# Patient Record
Sex: Female | Born: 1950 | Race: White | Hispanic: No | Smoking: Former smoker
Health system: Southern US, Community
[De-identification: ages and names within clinical notes are randomized; demographics above are authoritative.]

## PROBLEM LIST (undated history)

## (undated) DIAGNOSIS — T7840XA Allergy, unspecified, initial encounter: Secondary | ICD-10-CM

## (undated) DIAGNOSIS — I726 Aneurysm of vertebral artery: Secondary | ICD-10-CM

## (undated) DIAGNOSIS — I10 Essential (primary) hypertension: Secondary | ICD-10-CM

## (undated) DIAGNOSIS — K219 Gastro-esophageal reflux disease without esophagitis: Secondary | ICD-10-CM

## (undated) DIAGNOSIS — R4701 Aphasia: Secondary | ICD-10-CM

## (undated) DIAGNOSIS — I739 Peripheral vascular disease, unspecified: Secondary | ICD-10-CM

## (undated) DIAGNOSIS — I639 Cerebral infarction, unspecified: Secondary | ICD-10-CM

## (undated) DIAGNOSIS — F419 Anxiety disorder, unspecified: Secondary | ICD-10-CM

## (undated) DIAGNOSIS — E785 Hyperlipidemia, unspecified: Secondary | ICD-10-CM

## (undated) DIAGNOSIS — F32A Depression, unspecified: Secondary | ICD-10-CM

## (undated) DIAGNOSIS — F329 Major depressive disorder, single episode, unspecified: Secondary | ICD-10-CM

## (undated) HISTORY — DX: Anxiety disorder, unspecified: F41.9

## (undated) HISTORY — DX: Hyperlipidemia, unspecified: E78.5

## (undated) HISTORY — DX: Depression, unspecified: F32.A

## (undated) HISTORY — DX: Major depressive disorder, single episode, unspecified: F32.9

## (undated) HISTORY — DX: Allergy, unspecified, initial encounter: T78.40XA

---

## 2014-07-16 ENCOUNTER — Ambulatory Visit (INDEPENDENT_AMBULATORY_CARE_PROVIDER_SITE_OTHER): Payer: 59 | Admitting: Family Medicine

## 2014-07-16 VITALS — BP 185/90 | HR 86 | Temp 97.4°F | Resp 16 | Ht 60.25 in | Wt 159.6 lb

## 2014-07-16 DIAGNOSIS — F17200 Nicotine dependence, unspecified, uncomplicated: Secondary | ICD-10-CM

## 2014-07-16 DIAGNOSIS — I10 Essential (primary) hypertension: Secondary | ICD-10-CM

## 2014-07-16 MED ORDER — ATENOLOL 50 MG PO TABS: 50.0000 mg | ORAL_TABLET | Freq: Every day | ORAL | Status: DC

## 2014-07-16 NOTE — Progress Notes (Signed)
Chief Complaint:  Chief Complaint  Patient presents with  . Medication Refill    Atenolol 50 mg  . Headache    pt indicates she has headache because of no blood pressure meds    HPI: Diana Wells is a 63 y.o. female who is here for  HTN med refills She has had dx of HTN 6 months ago, She was lost to follow-up due to heathinsurance coverage She is just having HAs no CP or SOB, palpitations, n/v/abd pain, n/w/t [facility] and they did blood work and got blood work,BP was  177/96 but never gave her meds She is also a smoker used to smoke 1.5 ppd x since age 66, she is down to 1 ppd , has tried wellburin, chantix ( made her ahlluciante), she is tryign to taper down on her own   Past Medical History  Diagnosis Date  . Anxiety    History reviewed. No pertinent past surgical history. History   Social History  . Marital Status: Divorced    Spouse Name: N/A    Number of Children: N/A  . Years of Education: N/A   Social History Main Topics  . Smoking status: Light Tobacco Smoker  . Smokeless tobacco: None  . Alcohol Use: No  . Drug Use: No  . Sexual Activity: None   Other Topics Concern  . None   Social History Narrative  . None   History reviewed. No pertinent family history. No Known Allergies Prior to Admission medications   Medication Sig Start Date End Date Taking? Authorizing Provider  atenolol (TENORMIN) 50 MG tablet Take 50 mg by mouth daily.   Yes Historical Provider, MD     ROS: The patient denies fevers, chills, night sweats, unintentional weight loss, chest pain, palpitations, wheezing, dyspnea on exertion, nausea, vomiting, abdominal pain, dysuria, hematuria, melena, numbness, weakness, or tingling.   All other systems have been reviewed and were otherwise negative with the exception of those mentioned in the HPI and as above.    PHYSICAL EXAM: Filed Vitals:   07/16/14 1243  BP: 185/90  Pulse: 86  Temp: 97.4 F (36.3 C)  Resp:  16   Filed Vitals:   07/16/14 1243  Height: 5' 0.25" (1.53 m)  Weight: 159 lb 9.6 oz (72.394 kg)   Body mass index is 30.93 kg/(m^2).  General: Alert, no acute distress HEENT:  Normocephalic, atraumatic, oropharynx patent. EOMI, PERRLA, fundo exam normal Cardiovascular:  Regular rate and rhythm, no rubs murmurs or gallops.  No Carotid bruits, radial pulse intact. No pedal edema.  Respiratory: Clear to auscultation bilaterally.  No wheezes, rales, or rhonchi.  No cyanosis, no use of accessory musculature GI: No organomegaly, abdomen is soft and non-tender, positive bowel sounds.  No masses. Skin: No rashes. Neurologic: Facial musculature symmetric. CN 2-12 grossly normal Psychiatric: Patient is appropriate throughout our interaction. Lymphatic: No cervical lymphadenopathy Musculoskeletal: Gait intact.   LABS: No results found for this or any previous visit.   EKG/XRAY:   Primary read interpreted by Dr. Conley Rolls at [facility]   ASSESSMENT/PLAN: Encounter Diagnoses  Name Primary?  . Essential hypertension Yes  . Tobacco dependence    F/u in 1 month , decline any other tobacco cessation options Refilled atenolol Need BP recheck, [facility] currnetly asymptomatic BP goals advised at < 150/90 Go to ER prn for cP, SOB, worse HA of her life, n/v/abd pain   Gross sideeffects, risk and benefits,  and alternatives of medications d/w patient. Patient is aware that all medications have potential sideeffects and we are unable to predict every sideeffect or drug-drug interaction that may occur.  Coden Franchi PHUONG, DO 07/16/2014 1:32 PM

## 2014-07-16 NOTE — Patient Instructions (Signed)
Nicotine Addiction Nicotine can act as both a stimulant (excites/activates) and a sedative (calms/quiets). Immediately after exposure to nicotine, there is a "kick" caused in part by the drug's stimulation of the adrenal glands and resulting discharge of adrenaline (epinephrine). The rush of adrenaline stimulates the body and causes a sudden release of sugar. This means that smokers are always slightly hyperglycemic. Hyperglycemic means that the blood sugar is high, just like in diabetics. Nicotine also decreases the amount of insulin which helps control sugar levels in the body. There is an increase in blood pressure, breathing, and the rate of heart beats.  In addition, nicotine indirectly causes a release of dopamine in the brain that controls pleasure and motivation. A similar reaction is seen with other drugs of abuse, such as cocaine and heroin. This dopamine release is thought to cause the pleasurable sensations when smoking. In some different cases, nicotine can also create a calming effect, depending on sensitivity of the smoker's nervous system and the dose of nicotine taken. WHAT HAPPENS WHEN NICOTINE IS TAKEN FOR LONG PERIODS OF TIME?  Long-term use of nicotine results in addiction. It is difficult to stop.  Repeated use of nicotine creates tolerance. Higher doses of nicotine are needed to get the "kick." When nicotine use is stopped, withdrawal may last a month or more. Withdrawal may begin within a few hours after the last cigarette. Symptoms peak within the first few days and may lessen within a few weeks. For some people, however, symptoms may last for months or longer. Withdrawal symptoms include:   Irritability.  Craving.  Learning and attention deficits.  Sleep disturbances.  Increased appetite. Craving for tobacco may last for 6 months or longer. Many behaviors done while using nicotine can also play a part in the severity of withdrawal symptoms. For some people, the feel,  smell, and sight of a cigarette and the ritual of obtaining, handling, lighting, and smoking the cigarette are closely linked with the pleasure of smoking. When stopped, they also miss the related behaviors which make the withdrawal or craving worse. While nicotine gum and patches may lessen the drug aspects of withdrawal, cravings often persist. WHAT ARE THE MEDICAL CONSEQUENCES OF NICOTINE USE?  Nicotine addiction accounts for one-third of all cancers. The top cancer caused by tobacco is lung cancer. Lung cancer is the number one cancer killer of both men and women.  Smoking is also associated with cancers of the:  Mouth.  Pharynx.  Larynx.  Esophagus.  Stomach.  Pancreas.  Cervix.  Kidney.  Ureter.  Bladder.  Smoking also causes lung diseases such as lasting (chronic) bronchitis and emphysema.  It worsens asthma in adults and children.  Smoking increases the risk of heart disease, including:  Stroke.  Heart attack.  Vascular disease.  Aneurysm.  Passive or secondary smoke can also increase medical risks including:  Asthma in children.  Sudden Infant Death Syndrome (SIDS).  Additionally, dropped cigarettes are the leading cause of residential fire fatalities.  Nicotine poisoning has been reported from accidental ingestion of tobacco products by children and pets. Death usually results in a few minutes from respiratory failure (when a person stops breathing) caused by paralysis. TREATMENT   Medication. Nicotine replacement medicines such as nicotine gum and the patch are used to stop smoking. These medicines gradually lower the dosage of nicotine in the body. These medicines do not contain the carbon monoxide and other toxins found in tobacco smoke.  Hypnotherapy.  Relaxation therapy.  Nicotine Anonymous (a 12-step support   program). Find times and locations in your local yellow pages. Document Released: 08/05/2004 Document Revised: 02/22/2012 Document  Reviewed: 01/26/2014 ExitCare Patient Information 2015 [employer]. This information is not intended to replace advice given to you by your health care provider. Make sure you discuss any questions you have with your health care provider. Hypertension Hypertension, commonly called high blood pressure, is when the force of blood pumping through your arteries is too strong. Your arteries are the blood vessels that carry blood from your heart throughout your body. A blood pressure reading consists of a higher number over a lower number, such as 110/72. The higher number (systolic) is the pressure inside your arteries when your heart pumps. The lower number (diastolic) is the pressure inside your arteries when your heart relaxes. Ideally you want your blood pressure below 120/80. Hypertension forces your heart to work harder to pump blood. Your arteries may become narrow or stiff. Having hypertension puts you at risk for heart disease, stroke, and other problems.  RISK FACTORS Some risk factors for high blood pressure are controllable. Others are not.  Risk factors you cannot control include:   Race. You may be at higher risk if you are African American.  Age. Risk increases with age.  Gender. Men are at higher risk than women before age 45 years. After age 65, women are at higher risk than men. Risk factors you can control include:  Not getting enough exercise or physical activity.  Being overweight.  Getting too much fat, sugar, calories, or salt in your diet.  Drinking too much alcohol. SIGNS AND SYMPTOMS Hypertension does not usually cause signs or symptoms. Extremely high blood pressure (hypertensive crisis) may cause headache, anxiety, shortness of breath, and nosebleed. DIAGNOSIS  To check if you have hypertension, your health care provider will measure your blood pressure while you are seated, with your arm held at the level of your heart. It should be measured at least twice using  the same arm. Certain conditions can cause a difference in blood pressure between your right and left arms. A blood pressure reading that is higher than normal on one occasion does not mean that you need treatment. If one blood pressure reading is high, ask your health care provider about having it checked again. TREATMENT  Treating high blood pressure includes making lifestyle changes and possibly taking medicine. Living a healthy lifestyle can help lower high blood pressure. You may need to change some of your habits. Lifestyle changes may include:  Following the DASH diet. This diet is high in fruits, vegetables, and whole grains. It is low in salt, red meat, and added sugars.  Getting at least 2 hours of brisk physical activity every week.  Losing weight if necessary.  Not smoking.  Limiting alcoholic beverages.  Learning ways to reduce stress. If lifestyle changes are not enough to get your blood pressure under control, your health care provider may prescribe medicine. You may need to take more than one. Work closely with your health care provider to understand the risks and benefits. HOME CARE INSTRUCTIONS  Have your blood pressure rechecked as directed by your health care provider.   Take medicines only as directed by your health care provider. Follow the directions carefully. Blood pressure medicines must be taken as prescribed. The medicine does not work as well when you skip doses. Skipping doses also puts you at risk for problems.   Do not smoke.   Monitor your blood pressure at home as directed by   your health care provider. SEEK MEDICAL CARE IF:   You think you are having a reaction to medicines taken.  You have recurrent headaches or feel dizzy.  You have swelling in your ankles.  You have trouble with your vision. SEEK IMMEDIATE MEDICAL CARE IF:  You develop a severe headache or confusion.  You have unusual weakness, numbness, or feel faint.  You have  severe chest or abdominal pain.  You vomit repeatedly.  You have trouble breathing. MAKE SURE YOU:   Understand these instructions.  Will watch your condition.  Will get help right away if you are not doing well or get worse. Document Released: 11/30/2005 Document Revised: 04/16/2014 Document Reviewed: 09/22/2013 ExitCare Patient Information 2015 [employer]. This information is not intended to replace advice given to you by your health care provider. Make sure you discuss any questions you have with your health care provider.  

## 2014-07-17 ENCOUNTER — Inpatient Hospital Stay (HOSPITAL_COMMUNITY)
Admission: EM | Admit: 2014-07-17 | Discharge: 2014-07-19 | DRG: 066 | Disposition: A | Discharge status: Home / Self Care | Payer: 59 | Attending: Family Medicine | Admitting: Family Medicine

## 2014-07-17 ENCOUNTER — Inpatient Hospital Stay (HOSPITAL_COMMUNITY): Payer: 59

## 2014-07-17 ENCOUNTER — Emergency Department (HOSPITAL_COMMUNITY): Payer: 59

## 2014-07-17 ENCOUNTER — Encounter (HOSPITAL_COMMUNITY): Payer: Self-pay | Admitting: Emergency Medicine

## 2014-07-17 DIAGNOSIS — I728 Aneurysm of other specified arteries: Secondary | ICD-10-CM | POA: Diagnosis present

## 2014-07-17 DIAGNOSIS — F801 Expressive language disorder: Secondary | ICD-10-CM | POA: Diagnosis present

## 2014-07-17 DIAGNOSIS — Z79899 Other long term (current) drug therapy: Secondary | ICD-10-CM

## 2014-07-17 DIAGNOSIS — E785 Hyperlipidemia, unspecified: Secondary | ICD-10-CM

## 2014-07-17 DIAGNOSIS — Z880 Allergy status to penicillin: Secondary | ICD-10-CM

## 2014-07-17 DIAGNOSIS — I708 Atherosclerosis of other arteries: Secondary | ICD-10-CM | POA: Diagnosis present

## 2014-07-17 DIAGNOSIS — I6529 Occlusion and stenosis of unspecified carotid artery: Secondary | ICD-10-CM | POA: Diagnosis present

## 2014-07-17 DIAGNOSIS — I671 Cerebral aneurysm, nonruptured: Secondary | ICD-10-CM

## 2014-07-17 DIAGNOSIS — I10 Essential (primary) hypertension: Secondary | ICD-10-CM | POA: Diagnosis present

## 2014-07-17 DIAGNOSIS — E7800 Pure hypercholesterolemia, unspecified: Secondary | ICD-10-CM | POA: Diagnosis present

## 2014-07-17 DIAGNOSIS — J438 Other emphysema: Secondary | ICD-10-CM | POA: Diagnosis present

## 2014-07-17 DIAGNOSIS — Z66 Do not resuscitate: Secondary | ICD-10-CM | POA: Diagnosis present

## 2014-07-17 DIAGNOSIS — I6522 Occlusion and stenosis of left carotid artery: Secondary | ICD-10-CM

## 2014-07-17 DIAGNOSIS — I6350 Cerebral infarction due to unspecified occlusion or stenosis of unspecified cerebral artery: Secondary | ICD-10-CM | POA: Diagnosis not present

## 2014-07-17 DIAGNOSIS — I639 Cerebral infarction, unspecified: Secondary | ICD-10-CM | POA: Diagnosis present

## 2014-07-17 DIAGNOSIS — F17200 Nicotine dependence, unspecified, uncomplicated: Secondary | ICD-10-CM | POA: Diagnosis present

## 2014-07-17 HISTORY — DX: Essential (primary) hypertension: I10

## 2014-07-17 LAB — CBC
HCT: 43 % (ref 36.0–46.0)
Hemoglobin: 14.2 g/dL (ref 12.0–15.0)
MCH: 29.3 pg (ref 26.0–34.0)
MCHC: 33 g/dL (ref 30.0–36.0)
MCV: 88.8 fL (ref 78.0–100.0)
Platelets: 249 10*3/uL (ref 150–400)
RBC: 4.84 MIL/uL (ref 3.87–5.11)
RDW: 13.3 % (ref 11.5–15.5)
WBC: 11.3 10*3/uL — ABNORMAL HIGH (ref 4.0–10.5)

## 2014-07-17 LAB — COMPREHENSIVE METABOLIC PANEL
ALBUMIN: 4.4 g/dL (ref 3.5–5.2)
ALT: 15 U/L (ref 0–35)
AST: 16 U/L (ref 0–37)
Alkaline Phosphatase: 92 U/L (ref 39–117)
Anion gap: 15 (ref 5–15)
BILIRUBIN TOTAL: 0.5 mg/dL (ref 0.3–1.2)
BUN: 9 mg/dL (ref 6–23)
CO2: 23 mEq/L (ref 19–32)
CREATININE: 0.69 mg/dL (ref 0.50–1.10)
Calcium: 9.4 mg/dL (ref 8.4–10.5)
Chloride: 99 mEq/L (ref 96–112)
GFR calc Af Amer: >90 mL/min (ref >90)
GFR calc non Af Amer: >90 mL/min (ref >90)
Glucose, Bld: 121 mg/dL — ABNORMAL HIGH (ref 70–99)
Potassium: 3.6 mEq/L — ABNORMAL LOW (ref 3.7–5.3)
Sodium: 137 mEq/L (ref 137–147)
Total Protein: 7.3 g/dL (ref 6.0–8.3)

## 2014-07-17 LAB — COMPLETE METABOLIC PANEL WITH GFR
AST: 14 U/L (ref 0–37)
Albumin: 4.7 g/dL (ref 3.5–5.2)
Alkaline Phosphatase: 80 U/L (ref 39–117)
BUN: 8 mg/dL (ref 6–23)
CO2: 26 mEq/L (ref 19–32)
Creat: 0.7 mg/dL (ref 0.50–1.10)
GFR, Est African American: >89 mL/min
GFR, Est Non African American: >89 mL/min
Glucose, Bld: 124 mg/dL — ABNORMAL HIGH (ref 70–99)
Total Bilirubin: 0.5 mg/dL (ref 0.2–1.2)
Total Protein: 7 g/dL (ref 6.0–8.3)

## 2014-07-17 LAB — DIFFERENTIAL
BASOS PCT: 0 % (ref 0–1)
Basophils Absolute: 0 10*3/uL (ref 0.0–0.1)
Eosinophils Absolute: 0.1 10*3/uL (ref 0.0–0.7)
Eosinophils Relative: 0 % (ref 0–5)
Lymphocytes Relative: 18 % (ref 12–46)
Lymphs Abs: 2.1 10*3/uL (ref 0.7–4.0)
MONO ABS: 0.9 10*3/uL (ref 0.1–1.0)
Monocytes Relative: 8 % (ref 3–12)
NEUTROS ABS: 8.3 10*3/uL — AB (ref 1.7–7.7)
Neutrophils Relative %: 74 % (ref 43–77)

## 2014-07-17 LAB — COMPLETE METABOLIC PANEL WITHOUT GFR
ALT: 15 U/L (ref 0–35)
Calcium: 9.3 mg/dL (ref 8.4–10.5)
Chloride: 104 meq/L (ref 96–112)
Potassium: 4 meq/L (ref 3.5–5.3)
Sodium: 141 meq/L (ref 135–145)

## 2014-07-17 LAB — I-STAT TROPONIN, ED: TROPONIN I, POC: 0 ng/mL (ref 0.00–0.08)

## 2014-07-17 LAB — APTT: APTT: 28 s (ref 24–37)

## 2014-07-17 LAB — PROTIME-INR
INR: 1.04 (ref 0.00–1.49)
Prothrombin Time: 13.6 seconds (ref 11.6–15.2)

## 2014-07-17 MED ORDER — STROKE: EARLY STAGES OF RECOVERY BOOK
Freq: Once | Status: DC
  Filled 2014-07-17 (×2): qty 1

## 2014-07-17 MED ORDER — HEPARIN SODIUM (PORCINE) 5000 UNIT/ML IJ SOLN
5000.0000 [IU] | Freq: Three times a day (TID) | INTRAMUSCULAR | Status: DC
Start: 2014-07-17 — End: 2014-07-19
  Administered 2014-07-18 – 2014-07-19 (×5): 5000 [IU] via SUBCUTANEOUS
  Filled 2014-07-17 (×5): qty 1

## 2014-07-17 MED ORDER — SENNOSIDES-DOCUSATE SODIUM 8.6-50 MG PO TABS: 1.0000 | ORAL_TABLET | Freq: Every evening | ORAL | Status: DC | PRN

## 2014-07-17 MED ORDER — ASPIRIN EC 81 MG PO TBEC
81.0000 mg | DELAYED_RELEASE_TABLET | Freq: Every day | ORAL | Status: DC
  Administered 2014-07-18 – 2014-07-19 (×3): 81 mg via ORAL
  Filled 2014-07-17 (×4): qty 1

## 2014-07-17 NOTE — H&P (Signed)
[facility] and Physical Service Pager: [phone]  Patient name: Diana Wells Medical record number: [id number] Date of birth: 02/07/1951 Age: 63 y.o. Gender: female  Primary Care Provider: No PCP Per Patient Consultants: Neurology Code Status: DNR- after discussion with patinet   Chief Complaint: Aphasia   Assessment and Plan: Diana Wells is a 63 y.o. 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 PMH is significant for hypertension, anxiety and tobacco abuse.  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 Unsure of the exact etiology in this patient, as she seems fairly healthy per her daughter. Head is atraumatic with no history of falls. Given her onset of symptoms, she is not a candidate for tPA. EKG in NSR on admission. Will assess for carotid atherosclerosis, arrhthymias, and heart as sources for possible emoblus formation.  - Admit to neuro telemetry under FMTS, attending Dr. McDiarmid  - Neurology following, appreciate recs - MRI/MRA of the brain without contrast pending  - F.u Carotid dopplers  - F/u echocardiogram - Given unusual ST sloping on initial EKG, will repeat EKG in AM  - F/u neuro checks - F/u risk stratification labs: TSH, A1c, lipids - F/u CXR  - ASA 81mg   - Consider statin, pending fasting lipid panel in am - Bedside swallow evaluation - C/s speech, PT, and OT in the AM  Lung crackles on exam: Given patient's extensive smoking history and crackles noted on exam, there is a concern for some sort of pulmonary malignancy that has metastasized to the brain which could be contributing to her neurologic finding. No SOB. Will continue to monitor O2 saturation -CXR pending - May consider spirometry as an outpt to evaluate for COPD/emphysema   Hypertension: Patient on atenolol as an outpatient, just restarted on 8/3. - Will hold  anti-hypertensives currently  - likely do not need permissive HTN at this time, however will hold meds for the night given increased symptoms this am (L cheek numbness) which have improved today - Continue to monitor and consider re-starting in the AM  FEN/GI: NPO until she passes a swallow study> heart healthy, NS IV lock Prophylaxis: SQ heparin   Disposition: Admit to telemetry.   History of Present Illness: Diana Wells is a 63 y.o. female presenting with confusion and difficulty putting words together this this morning.  Of note, this history was taken from the patient's daughter, Kathreen CosierRhonda Tuggle.  The patient's daughter saw her this AM and noted that she seemed to be confused and would answer questions with 1-2 word phases.  The patient endorsed numbness of the left side of her face that began this AM.  The patient's daughter did not note any facial droop, drooling, or weakness. She denies any falls.  Of note, the patient had an unsteady gait last week (Wednesday or Thursday) which has since improved. At that time, she was noted to be walking sideways but no history of falls per the daughter.    Per the patient: She started noticing some confusion on Tuesday with difficulty finding words- it has been very frustrating. She does not recall any falls but wonders if maybe she hit her head at sometime.  Noted she did have a frontal headache yesterday when she went to see her PCP. Denies any numbness, tingling, weakness, change in vision, dizziness or headache currently. No chest pain or SOB.  Of note, the patient was  seen by [place] on 07/16/14 requesting medication refill of her atenolol and and endorsing a headache due to her hypertension. At that time her BP was 185/90. Per PE, there was not gross neurologic deficits noted at that time.   Review Of Systems:  Otherwise 12 point review of systems was performed and was unremarkable.  There are no active problems to display for this patient.  Past  Medical History: Past Medical History  Diagnosis Date  . Anxiety   Hypertension   Past Surgical History: History reviewed. No pertinent past surgical history. Social History: History  Substance Use Topics  . Smoking status: Light Tobacco Smoker  . Smokeless tobacco: Not on file  . Alcohol Use: No   Additional social history: Per PCP noted on 8/3: pt used to smoke 1.5ppd since the age of 68 and she is currently down to 1ppd. She smokes in secret, her daughter found cigarettes one day but was unsure how much she truly smoked.  Please also refer to relevant sections of EMR.  Family History: History reviewed. No pertinent family history. Allergies and Medications: Allergies  Allergen Reactions  . Codeine Other (See Comments)    Makes patient incoherent and involuntarily perform acts that she does not remember.   . Erythromycin Itching and Rash    Hallucinations  . Penicillins Itching and Rash    Hallucinations    No current facility-administered medications on file prior to encounter.   No current outpatient prescriptions on file prior to encounter.    Objective: BP 183/76  Pulse 82  Temp(Src) 98 F (36.7 C) (Oral)  Resp 18  Ht 5' (1.524 m)  Wt 150 lb (68.04 kg)  BMI 29.30 kg/m2  SpO2 98% Exam: General: 63 y/o female lying in bed in NAD HEENT: Atraumatic, Normocephalic. PERRLA, EOMI, Oropharynx clear. MMM.  Cardiovascular: RRR, no m/r/g noted.  Respiratory: Crackles in the lungs bilaterally in the base>apex. No wheezing or rhonchi noted, non labored  Abdomen: +BS. Soft ND/NT.  Extremities: No gross deformities noted. No edema Skin: No rashes Neuro: Alert. Oriented to person, situation, and place ([address]> ). Month was April but self corrected, difficulties finding the word August. Follows directions appropriately. Comprehension intact. Spontaneous language non-fluent with difficulties with word finding. Facial movements symmetric to smile and eyebrow  rise. Shoulder shrug and neck rotation intact and equal. Facial sensation intact and equal. Sensation of the LE and UE intact and equal. Grip strength nml and equal. UE and LE strength 5/5 bilaterally. Did not assess gait.  Labs and Imaging: CBC BMET   Recent Labs Lab 07/17/14 1902  WBC 11.3*  HGB 14.2  HCT 43.0  PLT 249    Recent Labs Lab 07/17/14 1902  NA 137  K 3.6*  CL 99  CO2 23  BUN 9  CREATININE 0.69  GLUCOSE 121*  CALCIUM 9.4    ISTAT Troponin 0.00   EKG 8/4: NSR. HR 82. Odd, sloping appearance of ST waves in II, III, and aVF with a return to baseline and upright T waves. T wave inversions in aVR and V1. QTc 434. No previous EKGs for comparisons  CT head 8/4: Hypodensities within the left basal ganglia and within the left occipital lobe which can be seen with age-indeterminate/likely subacute infarcts. There is mild expansion of the left basal ganglia with associated mass effect. As these represent different vascular distributions (MCA and PCA) recommend further evaluation with MRI/MRA.    Joanna Puff, MD 07/17/2014, 9:06 PM PGY-1, [facility] Family [facility] FPTS Intern pager: [phone], text pages welcome  I have seen and examined the patient with Dr. Leonides Schanz an I agree with her documentation above, my annotations are in blue.   Murtis Sink, MD [facility] Family Medicine Resident, PGY-3 07/18/2014, 12:10 AM

## 2014-07-17 NOTE — ED Notes (Signed)
Pt reports that last tuesday that she started having trouble being able to put words together. States that it seems to have remained about the same. States that she was seen at her PCP and told to come here for further work up.

## 2014-07-17 NOTE — Progress Notes (Signed)
Called E.D for report .  [facility]

## 2014-07-17 NOTE — ED Notes (Signed)
Transporting Patient to new room assignment. 

## 2014-07-17 NOTE — ED Notes (Signed)
When pt was asked why she came to the ED today she reports "I don't know."  When asked if she hurt she said no.  When asked if she had a medical condition or another problem that prompted her to come to the ED, pt stated "no."  Pt felt she did not need to be here and is upset with her daughter for bringing her here.  Upon further speaking with pt, found to be aphasic.  Unable to get the correct words out.  Told me today's date was [birth year], her age is [age].  Reports she saw her PMD yesterday, daughter stated she was given a prescription for atenolol.  Pt s/o told daughter that pt had been "walking sideways" that began approx one weeks ago and he had noticed other changes in her as well.

## 2014-07-17 NOTE — ED Provider Notes (Signed)
CSN: [id number]     Arrival date & time 07/17/14  1843 History   First MD Initiated Contact with Patient 07/17/14 1946     Chief Complaint  Patient presents with  . Aphasia     (Consider location/radiation/quality/duration/timing/severity/associated sxs/prior Treatment) HPI Comments: Pt comes in with cc of confusion. States that since last Thursday, she has been having trouble putting words together. Pt also states that may be she was a lttle unsteady to walk lat week, which has improved now. Pt has no numbness, tingling, weakness, no other complains Pt has HTN hx, no other medical problems.  The history is provided by the patient.    Past Medical History  Diagnosis Date  . Anxiety    History reviewed. No pertinent past surgical history. History reviewed. No pertinent family history. History  Substance Use Topics  . Smoking status: Light Tobacco Smoker  . Smokeless tobacco: Not on file  . Alcohol Use: No   OB History   Grav Para Term Preterm Abortions TAB SAB Ect Mult Living                 Review of Systems  Constitutional: Negative for activity change.  HENT: Negative for facial swelling.   Respiratory: Negative for cough, shortness of breath and wheezing.   Cardiovascular: Negative for chest pain.  Gastrointestinal: Negative for nausea, vomiting, abdominal pain, diarrhea, constipation, blood in stool and abdominal distention.  Genitourinary: Negative for hematuria and difficulty urinating.  Musculoskeletal: Negative for neck pain.  Skin: Negative for color change.  Neurological: Negative for dizziness, syncope, speech difficulty, weakness, light-headedness, numbness and headaches.  Hematological: Does not bruise/bleed easily.  Psychiatric/Behavioral: Negative for confusion.      Allergies  Codeine; Erythromycin; and Penicillins  Home Medications   Prior to Admission medications   Medication Sig Start Date End Date Taking? Authorizing Provider  atenolol  (TENORMIN) 50 MG tablet Take 1 tablet (50 mg total) by mouth daily. 07/16/14   Thao P Le, DO   BP 183/76  Pulse 87  Temp(Src) 97.6 F (36.4 C) (Oral)  Resp 18  Ht 5' (1.524 m)  Wt 150 lb (68.04 kg)  BMI 29.30 kg/m2  SpO2 99% Physical Exam  Nursing note and vitals reviewed. Constitutional: She is oriented to person, place, and time. She appears well-developed and well-nourished.  HENT:  Head: Normocephalic and atraumatic.  Eyes: EOM are normal. Pupils are equal, round, and reactive to light.  Neck: Neck supple.  Cardiovascular: Normal rate, regular rhythm and normal heart sounds.   No murmur heard. Pulmonary/Chest: Effort normal. No respiratory distress.  Abdominal: Soft. She exhibits no distension. There is no tenderness. There is no rebound and no guarding.  Neurological: She is alert and oriented to person, place, and time. No cranial nerve deficit. Coordination normal.  Pt has aphasia, otherwise.  Sensory exam normal for bilateral upper and lower extremities - and patient is able to discriminate between sharp and dull. Motor exam is 4+/5 for bilateral extremities.   Skin: Skin is warm and dry.    ED Course  Procedures (including critical care time) Labs Review Labs Reviewed  CBC - Abnormal; Notable for the following:    WBC 11.3 (*)    All other components within normal limits  DIFFERENTIAL - Abnormal; Notable for the following:    Neutro Abs 8.3 (*)    All other components within normal limits  PROTIME-INR  APTT  COMPREHENSIVE METABOLIC PANEL  I-STAT TROPOININ, ED    Imaging Review  Ct Head (brain) Wo Contrast  07/17/2014   CLINICAL DATA:  Speech difficulty  EXAM: CT HEAD WITHOUT CONTRAST  TECHNIQUE: Contiguous axial images were obtained from the base of the skull through the vertex without intravenous contrast.  COMPARISON:  None.  FINDINGS: Hypodensity within the left caudate and putamin which is mildly enlarged with mild mass effect on the adjacent lateral  ventricle. Additionally there is suggestion of peripheral hypodensity within the left occipital lobe. No evidence for mass effect, intracranial mass lesion or acute hemorrhage. Paranasal sinuses are unremarkable. Mastoid air cells are well aerated. Calvarium is intact.  IMPRESSION: Hypodensities within the left basal ganglia and within the left occipital lobe which can be seen with age-indeterminate/likely subacute infarcts. There is mild expansion of the left basal ganglia with associated mass effect. As these represent different vascular distributions (MCA and PCA) recommend further evaluation with MRI/MRA.  Critical Value/emergent results were called by telephone at the time of interpretation on 07/17/2014 at 7:47 pm to Dr. Langston Masker, who verbally acknowledged these results.   Electronically Signed   By: Annia Belt M.D.   On: 07/17/2014 19:52     EKG Interpretation   Date/Time:  Tuesday July 17 2014 18:59:52 EDT Ventricular Rate:  82 PR Interval:  124 QRS Duration: 94 QT Interval:  372 QTC Calculation: 434 R Axis:   77 Text Interpretation:  Normal sinus rhythm ST \\T \ T wave abnormality,  consider inferior ischemia Abnormal ECG No STEMI Confirmed by Jamilia Jacques,  MD, Liesel Peckenpaugh [phone]) on 07/17/2014 8:16:49 PM      MDM   Final diagnoses:  None    DDx includes:  Stroke - ischemic vs. hemorrhagic Aneurysms  Pt with hx of htn comes in with difficulty putting words together. She is noted to be aphasic, and her CT scan shows subacute stroke. Pt's stroke area is in 2 different vascular supply - so Rads is recommending further studies. We will get Neurology on board, and get studies as they recommend.  Not a TPA candidate given the onset was several days back.   Derwood Kaplan, MD 07/17/14 2023

## 2014-07-17 NOTE — Consult Note (Addendum)
Referring Physician: ED    Chief Complaint: language impairment,confusion  HPI:                                                                                                                                         Diana Wells is an 63 y.o. female, right handed, with a past medical history significant for HTN, smoking, and anxiety, brought in by family for further evaluation of the above stated symptoms. She lives home with family and she tells me that she was doing well until this past Thursday when she started noticing difficulty expressing herself, " I know what I want to say but can not get it completely out". In addition, she said that she has been a little bit confused and her daughter said that she doesn't recall her name. Denies associated HA, vertigo, double vision, difficulty swallowing, focal weakness or numbness, unsteadiness, or slurred speech. No CP, SOB, or palpitations. Daughter denied noticing face asymmetry.   CT brain today revealed hypodensities within the left basal ganglia and within the left occipital lobe. Date last known well: 7/31 Time last known well: uncertain tPA Given: no, late presentation   Past Medical History  Diagnosis Date  . Anxiety     History reviewed. No pertinent past surgical history.  History reviewed. No pertinent family history. Social History:  reports that she has been smoking.  She does not have any smokeless tobacco history on file. She reports that she does not drink alcohol or use illicit drugs.  Allergies:  Allergies  Allergen Reactions  . Codeine Other (See Comments)    Makes patient incoherent and involuntarily perform acts that she does not remember.   . Erythromycin Itching and Rash    Hallucinations  . Penicillins Itching and Rash    Hallucinations     Medications:                                                                                                                           I have reviewed the patient's current  medications.  ROS:  History obtained from the patient, family, and chart review.  General ROS: negative for - chills, fatigue, fever, night sweats, weight gain or weight loss Psychological ROS: negative for - behavioral disorder, hallucinations, memory difficulties, mood swings or suicidal ideation Ophthalmic ROS: negative for - blurry vision, double vision, eye pain or loss of vision ENT ROS: negative for - epistaxis, nasal discharge, oral lesions, sore throat, tinnitus or vertigo Allergy and Immunology ROS: negative for - hives or itchy/watery eyes Hematological and Lymphatic ROS: negative for - bleeding problems, bruising or swollen lymph nodes Endocrine ROS: negative for - galactorrhea, hair pattern changes, polydipsia/polyuria or temperature intolerance Respiratory ROS: negative for - cough, hemoptysis, shortness of breath or wheezing Cardiovascular ROS: negative for - chest pain, dyspnea on exertion, edema or irregular heartbeat Gastrointestinal ROS: negative for - abdominal pain, diarrhea, hematemesis, nausea/vomiting or stool incontinence Genito-Urinary ROS: negative for - dysuria, hematuria, incontinence or urinary frequency/urgency Musculoskeletal ROS: negative for - joint swelling or muscular weakness Neurological ROS: as noted in HPI Dermatological ROS: negative for rash and skin lesion changes  Physical exam: pleasant female in no apparent distress. Blood pressure 172/77, pulse 70, temperature 98 F (36.7 C), temperature source Oral, resp. rate 18, height 5' (1.524 m), weight 68.04 kg (150 lb), SpO2 96.00%. Head: normocephalic. Neck: supple, no bruits, no JVD. Cardiac: no murmurs. Lungs: clear. Abdomen: soft, no tender, no mass. Extremities: no edema. Neurologic Examination:                                                                                                       General: Mental Status: Alert, oriented, thought content appropriate. Comprehension intact but spontaneous language is non fluent. Follows comples commands without difficulty. Cranial Nerves: II: Discs flat bilaterally; Visual fields grossly normal, pupils equal, round, reactive to light and accommodation III,IV, VI: ptosis not present, extra-ocular motions intact bilaterally V,VII: smile symmetric, facial light touch sensation normal bilaterally VIII: hearing normal bilaterally IX,X: gag reflex present XI: bilateral shoulder shrug XII: midline tongue extension without atrophy or fasciculations Motor: Right : Upper extremity   5/5    Left:     Upper extremity   5/5  Lower extremity   5/5     Lower extremity   5/5 Tone and bulk:normal tone throughout; no atrophy noted Sensory: Pinprick and light touch intact throughout, bilaterally Deep Tendon Reflexes:  Right: Upper Extremity   Left: Upper extremity   biceps (C-5 to C-6) 2/4   biceps (C-5 to C-6) 2/4 tricep (C7) 2/4    triceps (C7) 2/4 Brachioradialis (C6) 2/4  Brachioradialis (C6) 2/4  Lower Extremity Lower Extremity  quadriceps (L-2 to L-4) 2/4   quadriceps (L-2 to L-4) 2/4 Achilles (S1) 2/4   Achilles (S1) 2/4  Plantars: Right: downgoing   Left: downgoing Cerebellar: normal finger-to-nose,  normal heel-to-shin test Gait:  No tested    Results for orders placed during the hospital encounter of 07/17/14 (from the past 48 hour(s))  PROTIME-INR     Status: None   Collection Time    07/17/14  7:02 PM  Result Value Ref Range   Prothrombin Time 13.6  11.6 - 15.2 seconds   INR 1.04  0.00 - 1.49  APTT     Status: None   Collection Time    07/17/14  7:02 PM      Result Value Ref Range   aPTT 28  24 - 37 seconds  CBC     Status: Abnormal   Collection Time    07/17/14  7:02 PM      Result Value Ref Range   WBC 11.3 (*) 4.0 - 10.5 K/uL   RBC 4.84  3.87 - 5.11 MIL/uL   Hemoglobin 14.2  12.0 -  15.0 g/dL   HCT 23.4  14.4 - 36.0 %   MCV 88.8  78.0 - 100.0 fL   MCH 29.3  26.0 - 34.0 pg   MCHC 33.0  30.0 - 36.0 g/dL   RDW 16.5  80.0 - 63.4 %   Platelets 249  150 - 400 K/uL  DIFFERENTIAL     Status: Abnormal   Collection Time    07/17/14  7:02 PM      Result Value Ref Range   Neutrophils Relative % 74  43 - 77 %   Neutro Abs 8.3 (*) 1.7 - 7.7 K/uL   Lymphocytes Relative 18  12 - 46 %   Lymphs Abs 2.1  0.7 - 4.0 K/uL   Monocytes Relative 8  3 - 12 %   Monocytes Absolute 0.9  0.1 - 1.0 K/uL   Eosinophils Relative 0  0 - 5 %   Eosinophils Absolute 0.1  0.0 - 0.7 K/uL   Basophils Relative 0  0 - 1 %   Basophils Absolute 0.0  0.0 - 0.1 K/uL  COMPREHENSIVE METABOLIC PANEL     Status: Abnormal   Collection Time    07/17/14  7:02 PM      Result Value Ref Range   Sodium 137  137 - 147 mEq/L   Potassium 3.6 (*) 3.7 - 5.3 mEq/L   Chloride 99  96 - 112 mEq/L   CO2 23  19 - 32 mEq/L   Glucose, Bld 121 (*) 70 - 99 mg/dL   BUN 9  6 - 23 mg/dL   Creatinine, Ser 9.49  0.50 - 1.10 mg/dL   Calcium 9.4  8.4 - 44.7 mg/dL   Total Protein 7.3  6.0 - 8.3 g/dL   Albumin 4.4  3.5 - 5.2 g/dL   AST 16  0 - 37 U/L   ALT 15  0 - 35 U/L   Alkaline Phosphatase 92  39 - 117 U/L   Total Bilirubin 0.5  0.3 - 1.2 mg/dL   GFR calc non Af Amer >90  >90 mL/min   GFR calc Af Amer >90  >90 mL/min   Comment: (NOTE)     The eGFR has been calculated using the CKD EPI equation.     This calculation has not been validated in all clinical situations.     eGFR's persistently <90 mL/min signify possible Chronic Kidney     Disease.   Anion gap 15  5 - 15  I-STAT TROPOININ, ED     Status: None   Collection Time    07/17/14  8:00 PM      Result Value Ref Range   Troponin i, poc 0.00  0.00 - 0.08 ng/mL   Comment 3            Comment: Due to the release kinetics  of cTnI,     a negative result within the first hours     of the onset of symptoms does not rule out     myocardial infarction with certainty.      If myocardial infarction is still suspected,     repeat the test at appropriate intervals.   Ct Head (brain) Wo Contrast  07/17/2014   CLINICAL DATA:  Speech difficulty  EXAM: CT HEAD WITHOUT CONTRAST  TECHNIQUE: Contiguous axial images were obtained from the base of the skull through the vertex without intravenous contrast.  COMPARISON:  None.  FINDINGS: Hypodensity within the left caudate and putamin which is mildly enlarged with mild mass effect on the adjacent lateral ventricle. Additionally there is suggestion of peripheral hypodensity within the left occipital lobe. No evidence for mass effect, intracranial mass lesion or acute hemorrhage. Paranasal sinuses are unremarkable. Mastoid air cells are well aerated. Calvarium is intact.  IMPRESSION: Hypodensities within the left basal ganglia and within the left occipital lobe which can be seen with age-indeterminate/likely subacute infarcts. There is mild expansion of the left basal ganglia with associated mass effect. As these represent different vascular distributions (MCA and PCA) recommend further evaluation with MRI/MRA.  Critical Value/emergent results were called by telephone at the time of interpretation on 07/17/2014 at 7:47 pm to Dr. Quitman Livings, who verbally acknowledged these results.   Electronically Signed   By: Lovey Newcomer M.D.   On: 07/17/2014 19:52    Assessment: 63 y.o. female with new onset language dysfunction. CT brain with findings suggestive of left BG and occipital subacute infarct but spontaneous language is non fluent and she has not other abnormalities on exam and thus suspect left cortical involvement. Admit to medicine and complete stroke work up. Will initiate aspirin 81 mg daily. Stroke team will resume care in am.  Stroke Risk Factors - HTN, smoking.  Plan: 1. HgbA1c, fasting lipid panel 2. MRI, MRA  of the brain without contrast 3. Echocardiogram 4. Carotid dopplers 5. Prophylactic therapy-aspirin 81 mg daily 6. Risk  factor modification 7. Telemetry monitoring 8. Frequent neuro checks 9. Speech therapy.   Dorian Pod, MD [facility] [phone]  07/17/2014, 9:30 PM

## 2014-07-18 ENCOUNTER — Inpatient Hospital Stay (HOSPITAL_COMMUNITY): Payer: 59

## 2014-07-18 ENCOUNTER — Encounter (HOSPITAL_COMMUNITY): Payer: Self-pay | Admitting: Radiology

## 2014-07-18 DIAGNOSIS — I517 Cardiomegaly: Secondary | ICD-10-CM

## 2014-07-18 DIAGNOSIS — I10 Essential (primary) hypertension: Secondary | ICD-10-CM

## 2014-07-18 DIAGNOSIS — F17200 Nicotine dependence, unspecified, uncomplicated: Secondary | ICD-10-CM

## 2014-07-18 DIAGNOSIS — E785 Hyperlipidemia, unspecified: Secondary | ICD-10-CM

## 2014-07-18 DIAGNOSIS — Z559 Problems related to education and literacy, unspecified: Secondary | ICD-10-CM

## 2014-07-18 LAB — LIPID PANEL
CHOL/HDL RATIO: 9 ratio
CHOLESTEROL: 260 mg/dL — AB (ref 0–200)
HDL: 29 mg/dL — ABNORMAL LOW (ref >39)
LDL Cholesterol: 197 mg/dL — ABNORMAL HIGH (ref 0–99)
TRIGLYCERIDES: 168 mg/dL — AB (ref <150)
VLDL: 34 mg/dL (ref 0–40)

## 2014-07-18 LAB — HEMOGLOBIN A1C
Hgb A1c MFr Bld: 6.3 % — ABNORMAL HIGH (ref <5.7)
MEAN PLASMA GLUCOSE: 134 mg/dL — AB (ref <117)

## 2014-07-18 MED ORDER — CLOPIDOGREL BISULFATE 75 MG PO TABS
75.0000 mg | ORAL_TABLET | Freq: Every day | ORAL | Status: DC
  Administered 2014-07-19: 75 mg via ORAL
  Filled 2014-07-18: qty 1

## 2014-07-18 MED ORDER — ATORVASTATIN CALCIUM 40 MG PO TABS: 40.0000 mg | ORAL_TABLET | Freq: Every day | ORAL | Status: DC

## 2014-07-18 MED ORDER — IOHEXOL 350 MG/ML SOLN
50.0000 mL | Freq: Once | INTRAVENOUS | Status: AC | PRN
  Administered 2014-07-18: 50 mL via INTRAVENOUS

## 2014-07-18 MED ORDER — LISINOPRIL 10 MG PO TABS
10.0000 mg | ORAL_TABLET | Freq: Every day | ORAL | Status: DC
  Administered 2014-07-18 – 2014-07-19 (×2): 10 mg via ORAL
  Filled 2014-07-18 (×2): qty 1

## 2014-07-18 MED ORDER — ATORVASTATIN CALCIUM 80 MG PO TABS
80.0000 mg | ORAL_TABLET | Freq: Every day | ORAL | Status: DC
  Administered 2014-07-18: 80 mg via ORAL
  Filled 2014-07-18: qty 1

## 2014-07-18 MED ORDER — CLOPIDOGREL BISULFATE 75 MG PO TABS
300.0000 mg | ORAL_TABLET | Freq: Once | ORAL | Status: AC
  Administered 2014-07-18: 300 mg via ORAL
  Filled 2014-07-18: qty 4

## 2014-07-18 MED ORDER — HYDROCHLOROTHIAZIDE 12.5 MG PO CAPS
12.5000 mg | ORAL_CAPSULE | Freq: Every day | ORAL | Status: DC
  Administered 2014-07-18 – 2014-07-19 (×2): 12.5 mg via ORAL
  Filled 2014-07-18 (×2): qty 1

## 2014-07-18 MED ORDER — POTASSIUM CHLORIDE CRYS ER 20 MEQ PO TBCR
20.0000 meq | EXTENDED_RELEASE_TABLET | Freq: Once | ORAL | Status: AC
  Administered 2014-07-18: 20 meq via ORAL
  Filled 2014-07-18: qty 1

## 2014-07-18 NOTE — Progress Notes (Signed)
Echo Lab  2D Echocardiogram completed.  Meda Dudzinski L Anav Lammert, RDCS 07/18/2014 2:48 PM

## 2014-07-18 NOTE — Progress Notes (Signed)
*  PRELIMINARY RESULTS* Vascular Ultrasound Carotid Duplex (Doppler) has been completed.  Preliminary findings: Bilateral:  Vertebral artery flow is antegrade.   Right = 1-39% ICA stenosis. Left = 60-79% ICA stenosis.    Farrel DemarkJill Eunice, RDMS, RVT  07/18/2014, 2:20 PM

## 2014-07-18 NOTE — Progress Notes (Signed)
Pt 63 year old white female admitted [facility]  from home, c/o unsteady gait evidence by  walking sideways  and words finding for almost a week . Per pt's daughter, pt walking has improved. Marland Kitchen. Pt awake alert and follows some command . Hx of HTN. Admitted for  Stroke work up. Admission hx and assessment completed . Noted  Some  expressive aphasia and  Intermittent confusion with time ad place. Pt reoriented to  Place, time  and situation.  Oriented to the room and use of call light for assistance.  RN will continue to monitor .

## 2014-07-18 NOTE — H&P (Signed)
I have seen and examined this patient. I have discussed with Dr Ermalinda MemosBradshaw.  I agree with their findings and plans as documented in their admission note.  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  Start in next 24 hours. Smoking Cessation counseling and assistance.

## 2014-07-18 NOTE — Progress Notes (Signed)
Family Medicine Teaching Service Daily Progress Note Intern Pager: [phone]  Patient name: Diana Wells Medical record number: [id number] Date of birth: Oct 15, 1951 Age: 63 y.o. Gender: female  Primary Care Provider: No PCP Per Patient Consultants: Neurology, neurosurgery Code Status: DNR after discussion with patient   Pt Overview and Major Events to Date:  8/4: Pt presenting with confusion and aphasia x approximately 5-7 days. CT showed infarcts in the MCA and PCA distribution. 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 PMH is significant for hypertension, anxiety and tobacco abuse.  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  - Admit to neuro telemetry under FMTS, attending Dr. McDiarmidary to MCA and PCA infarcts. Unsure of the exact etiology in this patient, as she seems fairly healthy per her daughter. Head is atraumatic with no history of falls. Given her onset of symptoms, she is not a candidate for tPA. EKG in NSR on admission. Will assess for carotid atherosclerosis, arrhthymias, and heart failure as sources for possible emboli formation  - Neurology following, appreciate recs   - F/u Carotid dopplers  - F/u echocardiogram  - Given unusual ST sloping on initial EKG, repeat EKG performed which appears stable/improved  - F/u neuro checks  - F/u risk stratification labs: TSH pending - ASA 81mg   - Start Lipitor 80mg  due to hypercholesterolemia   - Given 10mm aneurysm noted on MRI/MRA, will c/s neurosurgery - C/s speech, PT, and OT in the AM   Lung crackles on exam: No SOB, satting well on RA. Given patient's history, COPD would not be unexpected. No fevers/chills or other upper respiratory symptoms.  Will continue to monitor O2 saturation  -CXR showing  no acute process or malignancy  - May consider spirometry as an outpt to evaluate for COPD/emphysema   Hypertension: Patient on atenolol as an outpatient, just restarted on 8/3.  - Consider re-starting this AM as permissive HTN less likely to be an issue with her symptom onset 5-7 days ago.  - Consider switching atenolol to lisinopril/HCTZ  - Continue to monitor   FEN/GI: Heart healthy diet/ NS IV lock Prophylaxis: SQ heparin   Disposition: Possible discharge tomorrow   Subjective:  Patient with no complaints overnight. Still having issues with word finding/aphasia. No change in vision, weakness, or abnormal sensation. No chest pain or SOB. Eating well  Objective: Temp:  [97.6 F (36.4 C)-98 F (36.7 C)] 97.8 F (36.6 C) (08/05 0105) Pulse Rate:  [64-87] 64 (08/05 0105) Resp:  [16-20] 18 (08/05 0105) BP: (154-183)/(66-107) 154/107 mmHg (08/05 0105) SpO2:  [96 %-100 %] 96 % (08/05 0105) Weight:  [150 lb (68.04 kg)-161 lb 11.2 oz (73.347 kg)] 161 lb 11.2 oz (73.347 kg) (08/04 2307) Physical Exam: General: 62 y/o female sitting up in bed in NAD  HEENT: Atraumatic, Normocephalic. PERRLA, EOMI, Oropharynx clear. MMM.  Cardiovascular: RRR, no m/r/g noted.  Respiratory: Crackles in the lungs bilaterally in the base>apex. No wheezing or rhonchi noted.  Breathing comfortably on RA. Abdomen: +BS. Soft ND/NT.  Extremities: No gross deformities noted. No edema  Skin: No rashes  Neuro: Alert and oriented. Follows directions appropriately. Comprehension intact. Spontaneous language non-fluent with difficulties with word finding. Facial movements symmetric to smile and eyebrow rise. Facial sensation intact and equal. Grip strength nml  and equal. UE and LE strength 5/5 bilaterally. Gait not assessed.  Laboratory:  Recent Labs Lab 07/17/14 1902  WBC 11.3*  HGB 14.2  HCT 43.0  PLT 249    Recent Labs Lab 07/16/14 1336 07/17/14 1902  NA 141 137  K 4.0 3.6*  CL 104 99  CO2 26 23   BUN 8 9  CREATININE 0.70 0.69  CALCIUM 9.3 9.4  PROT 7.0 7.3  BILITOT 0.5 0.5  ALKPHOS 80 92  ALT 15 15  AST 14 16  GLUCOSE 124* 121*   Risk Stratification Labs  TSH No results found for this basename: tsh   Hemoglobin A1C    Component Value Date/Time   HGBA1C 6.3* 07/17/2014 1902   Lipid Panel     Component Value Date/Time   CHOL 260* 07/18/2014 0455   TRIG 168* 07/18/2014 0455   HDL 29* 07/18/2014 0455   CHOLHDL 9.0 07/18/2014 0455   VLDL 34 07/18/2014 0455   LDLCALC 197* 07/18/2014 0455   ISTAT Troponin 0.00   Imaging/Diagnostic Tests:   EKG 8/4: NSR. HR 82. Odd, sloping appearance of ST waves in II, III, and aVF with a return to baseline and upright T waves. T wave inversions in aVR and V1. QTc 434. No previous EKGs for comparisons   CT head 8/4: Hypodensities within the left basal ganglia and within the left occipital lobe which can be seen with age-indeterminate/likely subacute infarcts. There is mild expansion of the left basal ganglia with associated mass effect. As these represent different vascular distributions (MCA and PCA) recommend further evaluation with MRI/MRA.  MRI/MRA of the brain 8/4: MRI: 1. Multi focal acute ischemic left MCA territory ischemic infarcts involving the left basal ganglia and cortical and subcortical white matter of the left parietal and temporal lobes. No significant mass effect or evidence of hemorrhagic conversion. 2. Moderate chronic small vessel ischemic changes involving the supratentorial white matter and pons. MRA: 1. 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 2. 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 3. Moderate multi focal atherosclerotic irregularity within the cavernous segments of the internal carotid arteries bilaterally and left vertebral artery  CXR 8/5: No edema or consolidation. Atherosclerotic change with  areas of calcification in the aorta and left carotid artery.   Joanna Puffrystal S Dorsey, MD 07/18/2014, 1:53 AM PGY-1, [facility] FPTS Intern pager: [phone], text pages welcome

## 2014-07-18 NOTE — Evaluation (Signed)
Speech Language Pathology Evaluation Patient Details Name: Diana Wells Tickner MRN: [id number] DOB: 01/06/1951 Today's Date: 07/18/2014 Time: [id number] SLP Time Calculation (min): 27 min  Problem List:  Patient Active Problem List   Diagnosis Date Noted  . Stroke 07/17/2014  . HTN (hypertension) 07/17/2014  . Ischemic stroke 07/17/2014   Past Medical History:  Past Medical History  Diagnosis Date  . Anxiety   . Hypertension    Past Surgical History: History reviewed. No pertinent past surgical history. HPI:  Diana Wells Weisenburger is a 63 y.o. female, right handed, with PMH significant for HTN, smoking, and anxiety, brought in by family 07/17/2014 for further evaluation of language impairment and mild confusion since Thursday 7/30.  MRI revealed multi focal acute ischemic left MCA territory ischemic infarcts involving the left basal ganglia and cortical and subcortical white matter of the left parietal and temporal lobes.   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 Pt has adequate emergent awareness of linguistic errors, although requires Max multimodal cueing for correction. With sentence completion cues, phonemic cues, and Mod cues for additional wait time, pt was able to increase accuracy with confrontational naming tasks. Pt was able to read words at the single word level, which may be able to be used in her favor for functional communication. SLP provided communication board, and patient demonstrated use with Min cues. Pt will benefit from continued SLP services to maximize functional communication. Anticipate need for continued SLP services and 24/7 supervision upon discharge.    SLP Assessment  Patient needs continued Speech Lanaguage Pathology Services    Follow Up Recommendations  Outpatient SLP;24 hour supervision/assistance     Frequency and Duration min 2x/week  2 weeks   Pertinent Vitals/Pain n/a   SLP Goals  SLP Goals Potential to Achieve Goals: Good Potential Considerations: Ability to learn/carryover information  SLP Evaluation Prior Functioning  Cognitive/Linguistic Baseline: Within functional limits Type of Home: House  Lives With: Significant other Available Help at Discharge: Friend(s) (Works 2 days per week the rest 24/7 Simultaneous filing. User may not have seen previous data.)   Cognition  Overall Cognitive Status: Difficult to assess (due to aphasia) Arousal/Alertness: Awake/alert Orientation Level: Disoriented to person;Oriented to place;Oriented to time;Oriented to situation (knows name and age but does not know birthday) Attention: Sustained Sustained Attention: Appears intact Memory: Impaired Memory Impairment: Storage deficit;Retrieval deficit;Decreased recall of new information Awareness: Appears intact Problem Solving: Appears intact (for basic problem solving) Safety/Judgment: Appears intact    Comprehension  Auditory Comprehension Overall Auditory Comprehension: Impaired Yes/No Questions: Impaired Basic Biographical Questions: 76-100% accurate Basic Immediate Environment Questions: 75-100% accurate Complex Questions: 75-100% accurate Paragraph Comprehension (via yes/no questions): 76-100% accurate Commands: Impaired One Step Basic Commands: 50-74% accurate Two Step Basic Commands: 0-24% accurate Conversation: Simple Interfering Components: Working Radio broadcast assistantmemory EffectiveTechniques: Producer, television/film/videoxtra processing time;Repetition Visual Recognition/Discrimination Discrimination: Within Function Limits (up to a field of three) Reading Comprehension Reading Status: Within funtional limits (at the single word level-needs further assessment)    Expression Expression Primary Mode of Expression: Verbal Verbal Expression Overall Verbal Expression: Impaired Initiation: No impairment Automatic  Speech: Name;Social Response Level of Generative/Spontaneous Verbalization: Phrase Repetition: Impaired Level of Impairment: Phrase level Naming: Impairment Confrontation: Impaired Verbal Errors: Phonemic paraphasias;Aware of errors;Other (comment) (anomia) Pragmatics: No impairment Effective Techniques: Other (Comment);Sentence completion;Phonemic cues (extra wait time) Non-Verbal Means of Communication: Not applicable Written Expression Dominant Hand: Right Written  Expression: Within Functional Limits (for name and address-needs further assessment)   Oral / Motor Motor Speech Overall Motor Speech: Appears within functional limits for tasks assessed   GO      Maxcine Ham, M.A. CCC-SLP [phone]  Maxcine Ham 07/18/2014, 4:42 PM

## 2014-07-18 NOTE — Progress Notes (Signed)
Read, reviewed, edited and agree with student's findings and recommendations.  Hatim Homann B. Kaci Freel, PT, DPT #[phone]  

## 2014-07-18 NOTE — Progress Notes (Signed)
FMTS ATTENDING NOTE  Diana Arnaud,MD  I have discussed this patient with the resident and Dr McDiarmid. I agree with thier findings, assessment and care plan.     

## 2014-07-18 NOTE — Progress Notes (Signed)
OT Cancellation Note  Patient Details Name: Oliver HumBrenda Dechaine MRN: [id number] DOB: 05/16/1951   Cancelled Treatment:    Reason Eval/Treat Not Completed: Patient at procedure or test/ unavailable  Harolyn RutherfordJones, Jahon Bart B Pager: [phone]  07/18/2014, 2:26 PM

## 2014-07-18 NOTE — Progress Notes (Signed)
. Stroke Team Progress Note  HISTORY Diana Wells is an 63 y.o. female, right handed, with a past medical history significant for HTN, smoking, and anxiety, brought in by family 07/17/2014 for further evaluation of language impairment,confusion. She lives home with family and she tells that she was doing well until this past Thursday 07/12/2014 when she started noticing difficulty expressing herself, " I know what I want to say but can not get it completely out". In addition, she said that she has been a little bit confused and her daughter said that she doesn't recall her name. Denies associated HA, vertigo, double vision, difficulty swallowing, focal weakness or numbness, unsteadiness, or slurred speech. No CP, SOB, or palpitations. Daughter denied noticing face asymmetry. CT brain revealed hypodensities within the left basal ganglia and within the left occipital lobe. Patient was not administered TPA secondary to delay in arrival. She was admitted for further evaluation and treatment.  SUBJECTIVE Her husband is at the bedside.  Overall she feels her condition is unchanged. She remains with difficulty speaking, as she has since Thurs. No better, no worse. MRI and MRA done overnight.  OBJECTIVE Most recent Vital Signs: Filed Vitals:   07/18/14 0300 07/18/14 0503 07/18/14 0605 07/18/14 0857  BP: 137/59 124/63 150/83 142/65  Pulse: 60 59 72 75  Temp: 98.1 F (36.7 C) 98.5 F (36.9 C) 98 F (36.7 C) 98.4 F (36.9 C)  TempSrc: Oral Oral Oral Oral  Resp: 16 16 18 20   Height:      Weight:      SpO2: 98% 97% 98% 95%   CBG (last 3)  No results found for this basename: GLUCAP,  in the last 72 hours  IV Fluid Intake:     MEDICATIONS  .  stroke: mapping our early stages of recovery book   Does not apply Once  . aspirin EC  81 mg Oral Daily  . atorvastatin  80 mg Oral q1800  . heparin  5,000 Units Subcutaneous 3 times per day  . potassium chloride  20 mEq Oral Once   PRN:   senna-docusate  Diet:  Cardiac thin liquids Activity:  Bedrest DVT Prophylaxis:  Heparin 5000 units sq tid   CLINICALLY SIGNIFICANT STUDIES Basic Metabolic Panel:  Recent Labs Lab 07/16/14 1336 07/17/14 1902  NA 141 137  K 4.0 3.6*  CL 104 99  CO2 26 23  GLUCOSE 124* 121*  BUN 8 9  CREATININE 0.70 0.69  CALCIUM 9.3 9.4   Liver Function Tests:  Recent Labs Lab 07/16/14 1336 07/17/14 1902  AST 14 16  ALT 15 15  ALKPHOS 80 92  BILITOT 0.5 0.5  PROT 7.0 7.3  ALBUMIN 4.7 4.4   CBC:  Recent Labs Lab 07/17/14 1902  WBC 11.3*  NEUTROABS 8.3*  HGB 14.2  HCT 43.0  MCV 88.8  PLT 249   Coagulation:  Recent Labs Lab 07/17/14 1902  LABPROT 13.6  INR 1.04   Cardiac Enzymes: No results found for this basename: CKTOTAL, CKMB, CKMBINDEX, TROPONINI,  in the last 168 hours Urinalysis: No results found for this basename: COLORURINE, APPERANCEUR, LABSPEC, PHURINE, GLUCOSEU, HGBUR, BILIRUBINUR, KETONESUR, PROTEINUR, UROBILINOGEN, NITRITE, LEUKOCYTESUR,  in the last 168 hours Lipid Panel    Component Value Date/Time   CHOL 260* 07/18/2014 0455   TRIG 168* 07/18/2014 0455   HDL 29* 07/18/2014 0455   CHOLHDL 9.0 07/18/2014 0455   VLDL 34 07/18/2014 0455   LDLCALC 197* 07/18/2014 0455   HgbA1C  Lab Results  Component Value Date   HGBA1C 6.3* 07/17/2014    Urine Drug Screen:   No results found for this basename: labopia, cocainscrnur, labbenz, amphetmu, thcu, labbarb    Alcohol Level: No results found for this basename: ETH,  in the last 168 hours   CT of the brain  07/17/2014    Hypodensities within the left basal ganglia and within the left occipital lobe which can be seen with age-indeterminate/likely subacute infarcts. There is mild expansion of the left basal ganglia with associated mass effect. As these represent different vascular distributions (MCA and PCA) recommend further evaluation with MRI/MRA.   MRI of the brain  07/18/2014    1. Multi focal acute ischemic left MCA  territory ischemic infarcts involving the left basal ganglia and cortical and subcortical white matter of the left parietal and temporal lobes. No significant mass effect or evidence of hemorrhagic conversion. 2. Moderate chronic small vessel ischemic changes involving the supratentorial white matter and pons.   MRA of the brain  07/18/2014   1. 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 2. 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 3. Moderate multi focal atherosclerotic irregularity within the cavernous segments of the internal carotid arteries bilaterally and left vertebral artery.     Carotid Doppler   pending  2D Echocardiogram   - Left ventricle: The cavity size was normal. Wall thickness was increased in a pattern of mild LVH. Systolic function was normal. The estimated ejection fraction was in the range of 55% to 60%. Wall motion was normal; there were no regional wall motion abnormalities. Features are consistent with a pseudonormal left ventricular filling pattern, with concomitant abnormal relaxation and increased filling pressure (grade 2 diastolic dysfunction). - Aortic valve: There was no stenosis. - Mitral valve: There was no significant regurgitation. - Left atrium: The atrium was at the upper limits of normal in size. - Right ventricle: The cavity size was normal. Systolic function was normal. - Tricuspid valve: Peak RV-RA gradient (S): 21 mm Hg. - Pulmonary arteries: PA peak pressure: 24 mm Hg (S). - Inferior vena cava: The vessel was normal in size. The respirophasic diameter changes were in the normal range (>= 50%), consistent with normal central venous pressure. Impressions: - Normal LV size with mild LV hypertrophy. EF 55-60%. Moderate diastolic dysfunction. Normal RV size and systolic function. No significant valvular  abnormalities.  CXR  07/18/2014   No edema or consolidation. Atherosclerotic change with areas of calcification in the aorta and left carotid artery.   EKG  normal sinus rhythm, nonspecific ST and T waves changes. For complete results please see formal report.   Therapy Recommendations out pt speech therapy  Physical Exam    Temp:  [97.6 F (36.4 C)-98.7 F (37.1 C)] 97.9 F (36.6 C) (08/05 1829) Pulse Rate:  [59-87] 66 (08/05 1829) Resp:  [16-20] 20 (08/05 1829) BP: (124-183)/(59-107) 167/66 mmHg (08/05 1829) SpO2:  [95 %-100 %] 98 % (08/05 1829) Weight:  [150 lb (68.04 kg)-161 lb 11.2 oz (73.347 kg)] 161 lb 11.2 oz (73.347 kg) (08/04 2307)  General - Well nourished, well developed, in no apparent distress.  Ophthalmologic - Sharp disc margins OU.  Cardiovascular - Regular rate and rhythm with no murmur.  Mental Status -  Level of arousal and orientation to time, place, and person were intact. Language: non fluent aphasia, difficulty with expression,  impaired on naming and repetition.   Cranial Nerves II - XII - II - Vision intact OU. III, IV, VI - Extraocular movements intact. V - Facial sensation intact bilaterally. VII - Facial movement intact bilaterally. VIII - Hearing & vestibular intact bilaterally. X - Palate elevates symmetrically. XI - Chin turning & shoulder shrug intact bilaterally. XII - Tongue protrusion intact.  Motor Strength - The patient's strength was normal in all extremities and pronator drift was absent.  Bulk was normal and fasciculations were absent.   Motor Tone - Muscle tone was assessed at the neck and appendages and was normal.  Reflexes - The patient's reflexes were normal in all extremities and she had no pathological reflexes.  Sensory - Light touch, temperature/pinprick, vibration and proprioception, and Romberg testing were assessed and were normal.    Coordination - The patient had normal movements in the hands and feet with no ataxia or  dysmetria.  Tremor was absent.  Gait and Station - The patient's transfers, posture, gait, station, and turns were observed as normal.    ASSESSMENT Ms. Foy Mungia is a 63 y.o. female presenting with languauge impairment. Imaging confirms a patchy left MCA territory infarct in the setting of high grade 80% L M1 stenosis and L ICA cavernous stenosis. Infarct felt to be due to severe intracranial large artery atherosclerosis; unlikley to be embolic. Pending CUS report.  On no antithrombotics prior to admission. Now on aspirin 81 mg orally every day for secondary stroke prevention. Patient with resultant expressive aphasia. Stroke work up underway.  Hypertension, BP 124-142/63-76 past 24h, on atenolol 50 mg daily Hyperlipidemia, LDL 197, on no statin PTA, now on high dose lipitor 80 mg daily, goal LDL < 70  HgbA1c 6.3, within the goal Smoker, just quit for 2 months.  L [facility] aneurysm. Dr. Roda Shutters discussed films with Dr. Conchita Paris who recommends follow up with him after discharge for further evaluation and possible treatment.  Hospital day # 1  TREATMENT/PLAN  Given large artery atherosclerosis, load with plavix 300 mg today followed by aspirin 81 mg orally every day and clopidogrel 75 mg orally every day for secondary stroke prevention. (ordered)  OOB, therapy evals. Anticipate need for OP ST  F/u carotid doppler   High dose lipitor for stroke prevention  Continue to quit smoking.  OP follow up with Dr. Conchita Paris for aneurysm evaluation as an OP within a month  SHARON BIBY, MSN, RN, ANVP-BC, ANP-BC, GNP-BC [facility] Pager: [phone] 07/18/2014 9:54 AM  I, the attending vascular neurologist, have personally obtained a history, examined the patient, evaluated laboratory data, individually viewed imaging studies, and formulated the assessment and plan of care.  I have made any additions or clarifications directly to the above note and agree with the findings and plan as  currently documented.   SIGNED Marvel Plan, MD PhD Stroke Neurology 07/18/2014 6:48 PM  To contact Stroke Continuity provider, please refer to [email or web address]. After hours, contact General Neurology

## 2014-07-18 NOTE — Progress Notes (Signed)
Physical Therapy Evaluation Patient Details Name: Diana Wells MRN: [id number] DOB: 08/28/1951 Today's Date: 07/18/2014   History of Present Illness  63 y.o. F admitted 07/17/14 c/o language impairment and confusion. MRI shows "patchy left MCA territory infarct" with resultant expressive and mild receptive aphasia and R hand hemiparesis. PMHx of HTN, smoking, and anxiety.  Clinical Impression  Pt mobilizing 270' with min guard/supervision as pt staggered occasionally and subjectively did not feel completely stable since this was her first time up in a while. Pt has 24 hr supervision of significant other every day of the week but the two days he works. No follow up recommended by PT as pt at baseline level of function, but would recommend 24 hr supervision for pt safety.    Follow Up Recommendations No PT follow up;Supervision/Assistance - 24 hour    Equipment Recommendations  None recommended by PT    Recommendations for Other Services OT consult     Precautions / Restrictions Restrictions Weight Bearing Restrictions: No      Mobility  Bed Mobility Overal bed mobility: Needs Assistance Bed Mobility: Supine to Sit     Supine to sit: Supervision     General bed mobility comments: Supervision for pt safety.  Transfers Overall transfer level: Needs assistance Equipment used: None Transfers: Sit to/from Stand Sit to Stand: Supervision;Min guard         General transfer comment: Supervision/min guard for pt safety as this was her first time up in a while.  Ambulation/Gait Ambulation/Gait assistance: Supervision;Min guard Ambulation Distance (Feet): 270 Feet Assistive device: None Gait Pattern/deviations: Step-through pattern;Staggering right;Staggering left   Gait velocity interpretation: at or above normal speed for age/gender General Gait Details: Min guard for pt safety as staggered to the right and left and subjectively she was not feeling completely steady due to this  being her first time up.  Stairs Stairs: Yes Stairs assistance: Min guard Stair Management: One rail Left;Alternating pattern;Forwards Number of Stairs: 9 General stair comments: Min guard for pt safety on stairs.  Wheelchair Mobility    Modified Rankin (Stroke Patients Only) Modified Rankin (Stroke Patients Only) Pre-Morbid Rankin Score: No symptoms Modified Rankin: Moderate disability     Balance Overall balance assessment: Needs assistance Sitting-balance support: Feet unsupported;No upper extremity supported Sitting balance-Leahy Scale: Normal Sitting balance - Comments: Able to sit EOB without UE support and perform MMT without feet on floor.   Standing balance support: No upper extremity supported;During functional activity Standing balance-Leahy Scale: Good Standing balance comment: Staggered slightly during gait as this was her first time up in a while.                 Standardized Balance Assessment Standardized Balance Assessment : Dynamic Gait Index   Dynamic Gait Index Level Surface: Mild Impairment Change in Gait Speed: Mild Impairment Gait with Horizontal Head Turns: Normal Gait with Vertical Head Turns: Normal Gait and Pivot Turn: Normal Step Over Obstacle: Normal Step Around Obstacles: Normal Steps: Mild Impairment Total Score: 21       Pertinent Vitals/Pain See vitals flow sheet.     Home Living Family/patient expects to be discharged to:: Private residence Living Arrangements: Spouse/significant other Available Help at Discharge: Friend(s) (Works 2 days per week the rest 24/7 Simultaneous filing. User may not have seen previous data.) Type of Home: House Home Access: Stairs to enter Entrance Stairs-Rails: Left (4 staris in back,no railing on front stairs) Entrance Stairs-Number of Steps: 2 in front 4 in back Home Layout:  One level Home Equipment: None      Prior Function Level of Independence: Independent                Hand Dominance   Dominant Hand: Right    Extremity/Trunk Assessment   Upper Extremity Assessment: Defer to OT evaluation           Lower Extremity Assessment: Overall WFL for tasks assessed (5/5 MMT though had difficulty processing instructions)         Communication   Communication: Receptive difficulties;Expressive difficulties  Cognition Arousal/Alertness: Awake/alert Behavior During Therapy: WFL for tasks assessed/performed Overall Cognitive Status: Impaired/Different from baseline Area of Impairment: Following commands;Safety/judgement       Following Commands: Follows one step commands inconsistently Safety/Judgement: Decreased awareness of safety     General Comments: Pt agreed to sit in chair and went to sit in bed. Pt unable to follow 2 step commands during MMT.             Assessment/Plan    PT Assessment Patent does not need any further PT services  PT Diagnosis     PT Problem List    PT Treatment Interventions     PT Goals (Current goals can be found in the Care Plan section) Acute Rehab PT Goals Patient Stated Goal: Work on speech PT Goal Formulation: With patient Time For Goal Achievement: 08/01/14 Potential to Achieve Goals: Good     End of Session Equipment Utilized During Treatment: Gait belt Activity Tolerance: Patient tolerated treatment well Patient left: in chair;with call bell/phone within reach;with family/visitor present           Time: 7829-5621 PT Time Calculation (min): 24 min   Charges:   PT Evaluation $Initial PT Evaluation Tier I: 1 Procedure PT Treatments $Gait Training: 8-22 mins   PT G CodesMardi Mainland, SPT [phone]

## 2014-07-18 NOTE — Progress Notes (Signed)
UR complete.  Divit Stipp RN, MSN 

## 2014-07-18 NOTE — Discharge Summary (Signed)
Family Medicine [facility] Discharge Summary  Patient name: Diana HumBrenda Wells Medical record number: [id number] Date of birth: 10/21/1951 Age: 63 y.o. Gender: female Date of Admission: 07/17/2014  Date of Discharge: 07/19/2014 Admitting Physician: Dalbert MayotteAlison Snider, MD  Primary Care Provider: Dr. 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 Diana Wells is a 63 y/o female with a PMH of HTN presenting to the ED with her daughter with approximately 5-7 days of confusion and aphasia.  Approximately 7 days ago her daughter noticed an abnormality in her gait, but it had since resolved. Confusion onset began per the patient 7 days prior but began to worsen. Her daughter did not notice a change in her speech until the day of presentation.  A CT obtained in the ED showed hypodensities within the left basal ganglia and within the left occipital lobe, more concerning as they appeared to involve 2 distributions, the PCA and MCA. Neurology was consulted in the ED. Spontaneous language was non-fluent, however the patient was able to comprhend and follow complex commands. No other neurologic deficits were noted on exam by neurology.  Given her onset of symptoms, she was not a candidate for tPA. ASA 81mg  was started in the ED.   [facility] and placed on telemetry. Risk stratification labs revealed a LDL of 197, HDL of 29 and triglycerides of 168. She was started on high dose Lipitor. An MRI/MRA of the brain revealed multifocal acute ischemic left MCA territory ischemic infarcts  involving the left basal ganglia and cortical and subcortical white matter of the left parietal and temporal lobes as well as severe high grade stenosis of at least 80% of distal left M1 segment. Additionally, a 10x7.8x6.599mm aneurysm was noted in the left vertebral artery. She received a loading dose of Plavix 300mg  and then continued on a clopidogrel 75mg  QD for secondary stroke prevention. Neurovascular surgery was consulted for aneurysm evaluation and management and Dr. Conchita ParisNundkumar suggested an outpatient follow-up (and appointment was made for her prior to discharge).  Carotid dopplers noted left ICA stenosis of 70-75% and vascular surgery was consulted to evaluate. They felt she would require a left carotid endarterectomy and an appointment was made for her to follow up with them as an outpatient. Speech, OT and PT were consulted. Given her expressive aphasia it was recommended that she obtain outpatient SLP services: this was set up with the assistance of case management.   During her stay, she was never found to have any arrhthymias and it was felt that her CVA was most likely soley secondary to large artery atherosclerosis. She was found to have grade II diastolic dysfunction on echocardiogram.  The patient's blood pressure remained stable during this hospitalization. Atenolol was changed to lisinopril/HCTZ for secondary prevention of stroke.   On the day of discharge she continued to have difficulties expressing herself and would become very frustrated, however she was stable with no decline in function or new symptoms.   Issues for Follow Up:  -- Patient will require speech language therapy pathology as an outpatient -- New diagnosis of grade II diastolic dysfunction: consider outpatient follow up  with cardiology  -- Stress importance of remaining tobacco free  Significant Procedures: None   Significant Labs and Imaging:   Recent Labs Lab 07/17/14 1902  WBC 11.3*  HGB 14.2  HCT 43.0   PLT 249    Recent Labs Lab 07/16/14 1336 07/17/14 1902 07/19/14 0905  NA 141 137 139  K 4.0 3.6* 4.8  CL 104 99 102  CO2 26 23 24   GLUCOSE 124* 121* 152*  BUN 8 9 13   CREATININE 0.70 0.69 0.81  CALCIUM 9.3 9.4 9.6  ALKPHOS 80 92  --   AST 14 16  --   ALT 15 15  --   ALBUMIN 4.7 4.4  --    Risk Stratification Labs  Hemoglobin A1C    Component Value Date/Time   HGBA1C 6.3* 07/17/2014 1902   Lipid Panel     Component Value Date/Time   CHOL 260* 07/18/2014 0455   TRIG 168* 07/18/2014 0455   HDL 29* 07/18/2014 0455   CHOLHDL 9.0 07/18/2014 0455   VLDL 34 07/18/2014 0455   LDLCALC 197* 07/18/2014 0455    EKG 8/4: NSR. HR 82. Odd, sloping appearance of ST waves in II, III, and aVF with a return to baseline and upright T waves. T wave inversions in aVR and V1. QTc 434. No previous EKGs for comparisons   CT head 8/4: Hypodensities within the left basal ganglia and within the left occipital lobe which can be seen with age-indeterminate/likely subacute infarcts. There is mild expansion of the left basal ganglia with associated mass effect. As these represent different vascular distributions (MCA and PCA) recommend further evaluation with MRI/MRA.   MRI/MRA of the brain 8/4: MRI: 1. Multi focal acute ischemic left MCA territory ischemic infarcts involving the left basal ganglia and cortical and subcortical white matter of the left parietal and temporal lobes. No significant mass effect or evidence of hemorrhagic conversion. 2. Moderate chronic small vessel ischemic changes involving the supratentorial white matter and pons. MRA: 1. 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 2. 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 3. Moderate multi focal atherosclerotic irregularity within the cavernous segments of the internal carotid arteries bilaterally and left vertebral artery   CXR  8/5: No edema or consolidation. Atherosclerotic change with areas of calcification in the aorta and left carotid artery.   Echocardiogram 8/5: EF 55-60%. No regional wall motion abnormality. Grade II diastolic dysfunction.   Carotid Dopplers 8/5: Complex highly ulcerated plaque at the left carotid bifurcation. Short-segment proximal left ICA stenosis of up to 70 -75%. No other hemodynamically significant stenosis in the neck. Great vessel origin and right carotid bifurcation plaque. Dominant dolichoectatic left vertebral artery. 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 Non dominant right vertebral artery with soft and calcified plaque intracranially resulting in high-grade stenosis just proximal to the right PICA origin, and then right vertebral artery occlusion distal to the PICA.   Results/Tests Pending at Time of Discharge: None  Discharge Medications:    Medication List    STOP taking these medications       atenolol 50 MG tablet  Commonly known as:  TENORMIN      TAKE these medications       aspirin 81 MG EC tablet  Take 1 tablet (81 mg total) by mouth daily.     atorvastatin 80 MG tablet  Commonly  known as:  LIPITOR  Take 1 tablet (80 mg total) by mouth daily at 6 PM.     clopidogrel 75 MG tablet  Commonly known as:  PLAVIX  Take 1 tablet (75 mg total) by mouth daily.     hydrochlorothiazide 12.5 MG capsule  Commonly known as:  MICROZIDE  Take 1 capsule (12.5 mg total) by mouth daily.     lisinopril 10 MG tablet  Commonly known as:  PRINIVIL,ZESTRIL  Take 1 tablet (10 mg total) by mouth daily.        Discharge Instructions: Please refer to Patient Instructions section of EMR for full details.  Patient was counseled important signs and symptoms that should prompt return to medical care, changes in medications, dietary instructions, activity restrictions, and follow up appointments.   Follow-Up Appointments: Follow-up  Information   Follow up with Jackelyn Hoehn, MD On 07/23/2014. (at 1:30pm (neurosurgeon to discuss aneurysm). PLEASE call Thayer Ohm at [phone] to confirm appointment)    Specialty:  Neurosurgery   Contact information:   [address] [place], [address] [place] [address] [phone]       Follow up with LE, THAO PHUONG, DO On 08/03/2014. (at 10:30am for a hospital follow up with your PCP)    Specialty:  Family Medicine   Contact information:   [address] [place] [address] [phone]       Follow up with outpatient neuro rehab. (Office will call with appointment date/time)    Contact information:   [address], [address] [place], [place] [address]    [phone]      Follow up with Jorge Ny, MD On 07/27/2014. (keep appointment)    Specialty:  Vascular Surgery   Contact information:   [address] [place] [address] [phone]       Follow up with Xu,Jindong, MD. Call in 1 month. (stroke clinic)    Specialty:  Neurology   Contact information:   [address] [place] [address] [phone]       Joanna Puff, MD 07/22/2014, 5:05 PM PGY-1, [facility]

## 2014-07-19 DIAGNOSIS — I671 Cerebral aneurysm, nonruptured: Secondary | ICD-10-CM

## 2014-07-19 DIAGNOSIS — I6529 Occlusion and stenosis of unspecified carotid artery: Secondary | ICD-10-CM

## 2014-07-19 LAB — BASIC METABOLIC PANEL
Anion gap: 13 (ref 5–15)
BUN: 13 mg/dL (ref 6–23)
CO2: 24 mEq/L (ref 19–32)
CREATININE: 0.81 mg/dL (ref 0.50–1.10)
Calcium: 9.6 mg/dL (ref 8.4–10.5)
Chloride: 102 mEq/L (ref 96–112)
GFR, EST AFRICAN AMERICAN: 88 mL/min — AB (ref >90)
GFR, EST NON AFRICAN AMERICAN: 76 mL/min — AB (ref >90)
Glucose, Bld: 152 mg/dL — ABNORMAL HIGH (ref 70–99)
Potassium: 4.8 mEq/L (ref 3.7–5.3)
Sodium: 139 mEq/L (ref 137–147)

## 2014-07-19 MED ORDER — LISINOPRIL 10 MG PO TABS: 10.0000 mg | ORAL_TABLET | Freq: Every day | ORAL | Status: DC

## 2014-07-19 MED ORDER — HYDROCHLOROTHIAZIDE 12.5 MG PO CAPS: 12.5000 mg | ORAL_CAPSULE | Freq: Every day | ORAL | Status: DC

## 2014-07-19 MED ORDER — CLOPIDOGREL BISULFATE 75 MG PO TABS: 75.0000 mg | ORAL_TABLET | Freq: Every day | ORAL | Status: DC

## 2014-07-19 MED ORDER — ASPIRIN 81 MG PO TBEC: 81.0000 mg | DELAYED_RELEASE_TABLET | Freq: Every day | ORAL | Status: AC

## 2014-07-19 MED ORDER — ATORVASTATIN CALCIUM 80 MG PO TABS: 80.0000 mg | ORAL_TABLET | Freq: Every day | ORAL | Status: DC

## 2014-07-19 NOTE — Progress Notes (Signed)
Spoke with Diana Wells. Ok for d/c with close outpt f/[facility] on Friday [facility]
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
## 2014-07-19 NOTE — Consult Note (Signed)
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  She was unable to recall her daughters name.  She denise amaurosis fugax, no dysphasia, and no weakness.  Her medical history is positive for hypertension treated with atenolol and lisinopril..  She denise hypercholesterolemia, however she is now currently taking Lipitor.  She also denied DM and CAD.  She was loaded with Plavix,  She does have a smoking history and quit > 2 years ago.  A neck CTA was performed which showed a 70% left carotid stenosis.  Also an ultrasound showed 60-79% left carotid stenosis.  MRI reveals left brain infarct as well as a MCA M1 high-grade stenosis and aneurysm which neurosurgery will be evaluating. We have been asked to consult regarding left ICA stenosis 70-75% and expressive aphasia.        Current Facility-Administered Medications  Medication Dose Route Frequency Provider Last Rate Last Dose  .  stroke: mapping our early stages of recovery book   Does not apply Once Elenora Gamma, MD      . aspirin EC tablet 81 mg  81 mg Oral Daily Lunette Stands, MD   81 mg at 07/19/14 1059  . atorvastatin (LIPITOR) tablet 80 mg  80 mg Oral q1800 Elenora Gamma, MD   80 mg at 07/18/14 1735  . clopidogrel (PLAVIX) tablet 75 mg  75 mg Oral Daily Layne Benton, NP   75 mg at 07/19/14 1059  . heparin injection 5,000 Units  5,000 Units Subcutaneous 3 times per day Elenora Gamma, MD   5,000 Units at 07/19/14 [phone]  . lisinopril (PRINIVIL,ZESTRIL) tablet 10 mg  10 mg Oral Daily Ashly M Gottschalk, DO   10 mg at 07/19/14 1059   And  . hydrochlorothiazide (MICROZIDE) capsule 12.5 mg  12.5 mg Oral Daily Ashly M Gottschalk, DO   12.5 mg at 07/19/14  1059  . senna-docusate (Senokot-S) tablet 1 tablet  1 tablet Oral QHS PRN Elenora Gamma, MD        Pt meds include: Statin :Yes Betablocker: Yes ASA: Yes Other anticoagulants/antiplatelets: Plavix  Past Medical History  Diagnosis Date  . Anxiety   . Hypertension     History reviewed. No pertinent past surgical history.  Social History History  Substance Use Topics  . Smoking status: Light Tobacco Smoker  . Smokeless tobacco: Not on file  . Alcohol Use: No    Family History Family history: Mother DM, HTN, CHF Father DM  Allergies  Allergen Reactions  . Codeine Other (See Comments)    Makes patient incoherent and involuntarily perform acts that she does not remember.   . Erythromycin Itching and Rash    Hallucinations  . Penicillins Itching and Rash    Hallucinations      REVIEW OF SYSTEMS  General: [ ]  Weight loss, [ ]  Fever, [ ]  chills Neurologic: [ ]  Dizziness, [ ]  Blackouts, [ ]  Seizure [x ] Stroke, [ ]  "Mini stroke", [ ]  Slurred speech, [ ]  Temporary blindness; [ ]  weakness in arms or legs, [ ]  Hoarseness [ ]  Dysphagia [x]  aphasia ] Cardiac: [ ]  Chest pain/pressure, [ ]  Shortness of breath at rest [ ]  Shortness of breath with exertion, [ ]   Atrial fibrillation or irregular heartbeat  Vascular: [ ]  Pain in legs with walking, [ ]  Pain in legs at rest, [ ]  Pain in legs at night,  [ ]  Non-healing ulcer, [ ]  Blood clot in vein/DVT,   Pulmonary: [ ]  Home oxygen, [ ]  Productive cough, [ ]  Coughing up blood, [ ]  Asthma,  [ ]  Wheezing [ ]  COPD Musculoskeletal:  [ ]  Arthritis, [ ]  Low back pain, [ ]  Joint pain Hematologic: [ ]  Easy Bruising, [ ]  Anemia; [ ]  Hepatitis Gastrointestinal: [ ]  Blood in stool, [ ]  Gastroesophageal Reflux/heartburn, Urinary: [ ]  chronic Kidney disease, [ ]  on HD - [ ]  MWF or [ ]  TTHS, [ ]  Burning with urination, [ ]  Difficulty urinating Skin: [ ]  Rashes, [ ]  Wounds Psychological: [ ]  Anxiety, [ ]  Depression  Physical  Examination Filed Vitals:   07/18/14 2118 07/19/14 0104 07/19/14 0545 07/19/14 1037  BP: 151/67 124/53 112/50 147/83  Pulse: 68 62 60 73  Temp: 97.7 F (36.5 C) 97.8 F (36.6 C) 97.8 F (36.6 C) 98 F (36.7 C)  TempSrc: Oral Oral Oral Oral  Resp: 20 20 20 20   Height:      Weight:      SpO2: 98% 97% 99% 95%   Body mass index is 31.58 kg/(m^2).  General:  WDWN in NAD Gait: Normal HENT: WNL Eyes: Pupils equal Pulmonary: normal non-labored breathing , without Rales, rhonchi,  wheezing Cardiac: RRR, without  Murmurs, rubs or gallops; No carotid bruits Abdomen: soft, NT, no masses Skin: no rashes, ulcers noted;  no Gangrene , no cellulitis; no open wounds;   Vascular Exam/Pulses:Palpable radial, brachial, femoral DP/PT pulses all 2+ and equal.   Musculoskeletal: no muscle wasting or atrophy; no edema  Neurologic: A&O X 3; Appropriate Affect ;  SENSATION: normal; MOTOR FUNCTION: 5/5 Symmetric  No weakness noted Speech is fluent/normal   Significant Diagnostic Studies: CBC Lab Results  Component Value Date   WBC 11.3* 07/17/2014   HGB 14.2 07/17/2014   HCT 43.0 07/17/2014   MCV 88.8 07/17/2014   PLT 249 07/17/2014    BMET    Component Value Date/Time   NA 139 07/19/2014 0905   K 4.8 07/19/2014 0905   CL 102 07/19/2014 0905   CO2 24 07/19/2014 0905   GLUCOSE 152* 07/19/2014 0905   BUN 13 07/19/2014 0905   CREATININE 0.81 07/19/2014 0905   CREATININE 0.70 07/16/2014 1336   CALCIUM 9.6 07/19/2014 0905   GFRNONAA 76* 07/19/2014 0905   GFRNONAA >89 07/16/2014 1336   GFRAA 88* 07/19/2014 0905   GFRAA >89 07/16/2014 1336   Estimated Creatinine Clearance: 64.3 ml/min (by C-G formula based on Cr of 0.81).  COAG Lab Results  Component Value Date   INR 1.04 07/17/2014     Non-Invasive Vascular Imaging:  CTA neck:IMPRESSION:  1. Complex highly ulcerated plaque at the left carotid bifurcation.  Short-segment proximal left ICA stenosis of up to 70 -75%. See  sagittal image 134 of series 404.  2.  No other hemodynamically significant stenosis in the neck. Great  vessel origin and right carotid bifurcation plaque.  3. Dominant dolichoectatic left vertebral artery. 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  4. Non dominant right vertebral artery with soft and calcified  plaque intracranially resulting in high-grade stenosis just proximal  to the right PICA origin, and then right vertebral artery occlusion  distal to the PICA.    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 She has stopped smoking and is now on maximum medical management with the statins, plavix and beta blockers. Dr. Myra GianottiBrabham will discuss the surgical plan with her and her family     Clinton GallantCOLLINS, EMMA [facility] 07/19/2014 11:11 AM  I agree with the above.  I have seen and examined the patient with her daughter in the room.  She has symptomatic left ICA stenosis, documented by u/s and CTA.  She is still having trouble with word finding.  I discussed that I feel it is best to delay her carotid endarterectomy until next week.  I discussed the risk and benefits of surgery, including the risk of stroke, nerve injury and bleeding.  She can continue her palvix.  She is scheduled to see neurosurgery as an outpatient regarding the findings on MRI.  I spent >45 minutes discussing this with the patient.  Durene CalWells Brabham

## 2014-07-19 NOTE — Discharge Instructions (Signed)
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 Please call to confirm your appointment as soon as possible. I've also made you an appointment with your PCP, Dr. Conley Rolls. If you would like to be seen early, you may call to see when she is is and walk-in.    Ischemic Stroke A stroke (cerebrovascular accident) is the sudden death of brain tissue. It is a medical emergency. A stroke can cause permanent loss of brain function. This can cause problems with different parts of your body. A transient ischemic attack (TIA) is different because it does not cause permanent damage. A TIA is a short-lived problem of poor blood flow affecting a part of the brain. A TIA is also a serious problem because having a TIA greatly increases the chances of having a stroke. When symptoms first develop, you cannot know if the problem might be a stroke or a TIA. CAUSES  A stroke is caused by a decrease of oxygen supply to an area of your brain. It is usually the result of a small blood clot or collection of cholesterol or fat (plaque) that blocks blood flow in the brain. A stroke can also be caused by blocked or damaged carotid arteries.  RISK FACTORS  High blood pressure (hypertension).  High cholesterol.  Diabetes mellitus.  Heart disease.  The buildup of plaque in the blood vessels (peripheral artery disease or atherosclerosis).  The buildup of plaque in the blood vessels providing blood and oxygen to the brain (carotid artery stenosis).  An abnormal heart rhythm (atrial fibrillation).  Obesity.  Smoking.  Taking oral contraceptives (especially in combination with smoking).  Physical inactivity.  A diet high in fats, salt (sodium), and calories.  Alcohol use.  Use of illegal drugs (especially cocaine and methamphetamine).  Being African American.  Being over  the age of 84.  Family history of stroke.  Previous history of blood clots, stroke, TIA, or heart attack.  Sickle cell disease. SYMPTOMS  These symptoms usually develop suddenly, or may be newly present upon awakening from sleep:  Sudden weakness or numbness of the face, arm, or leg, especially on one side of the body.  Sudden trouble walking or difficulty moving arms or legs.  Sudden confusion.  Sudden personality changes.  Trouble speaking (aphasia) or understanding.  Difficulty swallowing.  Sudden trouble seeing in one or both eyes.  Double vision.  Dizziness.  Loss of balance or coordination.  Sudden severe headache with no known cause.  Trouble reading or writing. DIAGNOSIS  Your health care provider can often determine the presence or absence of a stroke based on your symptoms, history, and physical exam. Computed tomography (CT) of the brain is usually performed to confirm the stroke, determine causes, and determine stroke severity. Other tests may be done to find the cause of the stroke. These tests may include:  Electrocardiography.  Continuous heart monitoring.  Echocardiography.  Carotid ultrasonography.  Magnetic resonance imaging (MRI).  A scan of the brain circulation.  Blood tests. PREVENTION  The risk of a stroke can be decreased by appropriately treating high blood pressure, high cholesterol, diabetes, heart disease, and obesity and by quitting smoking, limiting alcohol, and staying physically active. TREATMENT  Time is of the essence. It is important to seek treatment at the first sign of these symptoms because you may receive a medicine to dissolve  the clot (thrombolytic) that cannot be given if too much time has passed since your symptoms began. Even if you do not know when your symptoms began, get treatment as soon as possible as there are other treatment options available including oxygen, intravenous (IV) fluids, and medicines to thin the  blood (anticoagulants). Treatment of stroke depends on the duration, severity, and cause of your symptoms. Medicines and dietary changes may be used to address diabetes, high blood pressure, and other risk factors. Physical, speech, and occupational therapists will assess you and work with you to improve any functions impaired by the stroke. Measures will be taken to prevent short-term and long-term complications, including infection from breathing foreign material into the lungs (aspiration pneumonia), blood clots in the legs, bedsores, and falls. Rarely, surgery may be needed to remove large blood clots or to open up blocked arteries. HOME CARE INSTRUCTIONS   Take medicines only as directed by your health care provider. Follow the directions carefully. Medicines may be used to control risk factors for a stroke. Be sure you understand all your medicine instructions.  You may be told to take a medicine to thin the blood, such as aspirin or the anticoagulant warfarin. Warfarin needs to be taken exactly as instructed.  Too much and too little warfarin are both dangerous. Too much warfarin increases the risk of bleeding. Too little warfarin continues to allow the risk for blood clots. While taking warfarin, you will need to have regular blood tests to measure your blood clotting time. These blood tests usually include both the PT and INR tests. The PT and INR results allow your health care provider to adjust your dose of warfarin. The dose can change for many reasons. It is critically important that you take warfarin exactly as prescribed, and that you have your PT and INR levels drawn exactly as directed.  Many foods, especially foods high in vitamin K, can interfere with warfarin and affect the PT and INR results. Foods high in vitamin K include spinach, kale, broccoli, cabbage, collard and turnip greens, brussels sprouts, peas, cauliflower, seaweed, and parsley, as well as beef and pork liver, green tea,  and soybean oil. You should eat a consistent amount of foods high in vitamin K. Avoid major changes in your diet, or notify your health care provider before changing your diet. Arrange a visit with a dietitian to answer your questions.  Many medicines can interfere with warfarin and affect the PT and INR results. You must tell your health care provider about any and all medicines you take. This includes all vitamins and supplements. Be especially cautious with aspirin and anti-inflammatory medicines. Do not take or discontinue any prescribed or over-the-counter medicine except on the advice of your health care provider or pharmacist.  Warfarin can have side effects, such as excessive bruising or bleeding. You will need to hold pressure over cuts for longer than usual. Your health care provider or pharmacist will discuss other potential side effects.  Avoid sports or activities that may cause injury or bleeding.  Be mindful when shaving, flossing your teeth, or handling sharp objects.  Alcohol can change the body's ability to handle warfarin. It is best to avoid alcoholic drinks or consume only very small amounts while taking warfarin. Notify your health care provider if you change your alcohol intake.  Notify your dentist or other health care providers before procedures.  If swallow studies have determined that your swallowing reflex is present, you should eat healthy foods. Including 5 or  more servings of fruits and vegetables a day may reduce the risk of stroke. Foods may need to be a certain consistency (soft or pureed), or small bites may need to be taken in order to avoid aspirating or choking. Certain dietary changes may be advised to address high blood pressure, high cholesterol, diabetes, or obesity.  Food choices that are low in sodium, saturated fat, trans fat, and cholesterol are recommended to manage high blood pressure.  Food choies that are high in fiber, and low in saturated fat,  trans fat, and cholesterol may control cholesterol levels.  Controlling carbohydrates and sugar intake is recommended to manage diabetes.  Reducing calorie intake and making food choices that are low in sodium, saturated fat, trans fat, and cholesterol are recommended to manage obesity.  Maintain a healthy weight.  Stay physically active. It is recommended that you get at least 30 minutes of activity on all or most days.  Do not use any tobacco products including cigarettes, chewing tobacco, or electronic cigarettes.  Limit alcohol use even if you are not taking warfarin. Moderate alcohol use is considered to be:  No more than 2 drinks each day for men.  No more than 1 drink each day for nonpregnant women.  Home safety. A safe home environment is important to reduce the risk of falls. Your health care provider may arrange for specialists to evaluate your home. Having grab bars in the bedroom and bathroom is often important. Your health care provider may arrange for equipment to be used at home, such as raised toilets and a seat for the shower.  Physical, occupational, and speech therapy. Ongoing therapy may be needed to maximize your recovery after a stroke. If you have been advised to use a walker or a cane, use it at all times. Be sure to keep your therapy appointments.  Follow all instructions for follow-up with your health care provider. This is very important. This includes any referrals, physical therapy, rehabilitation, and lab tests. Proper follow-up can prevent another stroke from occurring. SEEK MEDICAL CARE IF:  You have personality changes.  You have difficulty swallowing.  You are seeing double.  You have dizziness.  You have a fever.  You have skin breakdown. SEEK IMMEDIATE MEDICAL CARE IF:  Any of these symptoms may represent a serious problem that is an emergency. Do not wait to see if the symptoms will go away. Get medical help right away. Call your local  emergency services (911 in [place] Do not drive yourself to the hospital.  You have sudden weakness or numbness of the face, arm, or leg, especially on one side of the body.  You have sudden trouble walking or difficulty moving arms or legs.  You have sudden confusion.  You have trouble speaking (aphasia) or understanding.  You have sudden trouble seeing in one or both eyes.  You have a loss of balance or coordination.  You have a sudden, severe headache with no known cause.  You have new chest pain or an irregular heartbeat.  You have a partial or total loss of consciousness. Document Released: 11/30/2005 Document Revised: 04/16/2014 Document Reviewed: 07/10/2012 [facility] Patient Information 2015 [place], [place]. This information is not intended to replace advice given to you by your health care provider. Make sure you discuss any questions you have with your health care provider.  Carotid Artery Disease  The carotid arteries are arteries on both sides of the neck. They carry blood to the brain. Carotid artery disease is when  the arteries get smaller (narrow) or get blocked. If these arteries get smaller or get blocked, you are more likely to have a stroke or warning stroke (transient ischemic attack).  HOME CARE  Take medicines as told by your doctor. Make sure you understand all your medicine instructions. Do not stop your medicines without talking to your doctor first.  Follow your doctor's diet instructions. It is important to eat a healthy diet that includes plenty of:  Fresh fruits.  Vegetables.  Lean meats.  Avoid:  High-fat foods.  High-sodium foods.  Foods that are fried, overly processed, or have poor nutritional value.  Stay a healthy weight.  Stay active. Get at least 30 minutes of activity every day.  Do not smoke.  Limit alcohol use to:  No more than 2 drinks a day for men.  No more than 1 drink a day for women who are not pregnant.  Do not use  illegal drugs.  Keep all doctor visits as told. GET HELP RIGHT AWAY IF:   You have sudden weakness or loss of feeling (numbness) on one side of the body, such as the face, arm, or leg.  You have sudden confusion.  You have trouble speaking (aphasia) or understanding.  You have sudden trouble seeing out of one or both eyes.  You have sudden trouble walking.  You have dizziness or feel like you might pass out (faint).  You have a loss of balance or your movements are not steady (uncoordinated).  You have a sudden, severe headache with no known cause.  You have trouble swallowing (dysphagia). Call your local emergency services (911 in [place] Do notdrive yourself to the clinic or hospital.  Document Released: 11/16/2012 Document Revised: 08/02/2013 Document Reviewed: 05/31/2013 [facility] Patient Information 2015 [place]. This information is not intended to replace advice given to you by your health care provider. Make sure you discuss any questions you have with your health care provider.  Cerebral Aneurysm An aneurysm is the bulging or ballooning out of part of the weakened wall of a vein or artery. An aneurysm in the vein or artery of the brain is called a brain aneurysm, or cerebral aneurysm.  Aneurysms are a risk to your health because they may leak or rupture. Once the aneurysm leaks or ruptures, bleeding occurs. If the bleeding occurs within the brain tissue, the condition is called an intracerebral hemorrhage. An intracerebral hemorrhage can result in a hemorrhagic stroke. If the bleeding occurs in the area between the brain and the thin tissues that cover the brain, the condition is called a subarachnoid hemorrhage. This increases the pressure on the brain and causes some areas of the brain to not get the necessary blood flow. The blood from the ruptured aneurysm collects and presses on the surrounding brain tissue. A subarachnoid hemorrhage can cause a stroke. A ruptured  cerebral aneurysm is a medical emergency. This can cause permanent damage and loss of brain function. CAUSES A cerebral aneurysm is caused when a weakened part of the blood vessel expands. The blood vessel expands due to the constant pressure from the flow of blood through the weakened blood vessel. Usually the aneurysm expands slowly. As the weakened aneurysm expands, the walls of the aneurysm become weaker. Aneurysms may be associated with diseases that weaken and damage the walls of your blood vessels or blood vessels that develop abnormally. Some known causes for cerebral aneurysms are:  Head trauma.  Infection.  Use of "recreational drugs" such as cocaine or  amphetamines. RISK FACTORS People at risk for a cerebral aneurysm or hemorrhagic stroke usually have one or more risk factors, which include:  Having high blood pressure (hypertension).  Abusing alcohol.  Having abnormal blood vessels present since birth.  Having certain bleeding disorders, such as hemophilia, sickle cell disease, or liver disease.  Taking blood thinners (anticoagulants).  Smoking. SIGNS AND SYMPTOMS  The signs and symptoms of an unruptured cerebral aneurysm will partly depend on its size and rate of growth. A small, unchanging aneurysm generally does not produce symptoms. A larger aneurysm that is steadily growing can increase pressure on the brain or nerves. That increased pressure from the unruptured cerebral aneurysm can cause:  A headache.  Problems with your vision.  Numbness or weakness in an arm or leg.  Problems with memory.  Problems speaking.  Seizures. If an aneurysm leaks or bursts, it can cause a stroke and be life-threatening. Symptoms may include:  A sudden, severe headache with no known cause. The headache is often described as the worst headache ever experienced.  Nausea or vomiting, especially when combined with other symptoms such as a headache.  Sudden weakness or numbness of  the face, arm, or leg, especially on one side of the body.  Sudden trouble walking or difficulty moving arms or legs.  Sudden confusion.  Sudden personality changes.  Trouble speaking (aphasia) or understanding.  Difficulty swallowing.  Sudden trouble seeing in one or both eyes.  Double vision.  Dizziness.  Loss of balance or coordination.  Intolerance to light.  Stiff neck. DIAGNOSIS  A CTA (computed tomographic angiography) may be performed to diagnose an aneurysm. A CTA uses dye and a CT scanner to take images of your blood vessels. An MRA (magnetic resonance angiography) may be used to diagnose an aneurysm. An MRA is performed in an MRI machine. While in the MRI machine, images of your blood vessels are taken. A cerebral aneurysm may also be diagnosed with a cerebral angiogram. A cerebral angiogram requires a tube called a catheter to be inserted into a blood vessel and advanced to the blood vessels in your neck. Dye is then injected while X-ray images are taken to show the blood vessels in your brain. TREATMENT  Unruptured Aneurysms Treatment is complex when an aneurysm is found and it is not causing problems. Treatment is very individualized, as each case is different. Many things must be considered, such as the size and exact location of your aneurysm, your age, your overall health, and your feelings and preferences. Small aneurysms in certain locations of the brain have a very low chance of bleeding or rupturing. These small aneurysms may not be treated. However, depending on the size and location of the aneurysm, treatments may be recommended and include:  Coiling. During this procedure, a catheter is inserted and advanced through a blood vessel. Once the catheter reaches the aneurysm, tiny coils are used to block blood flow into the aneurysm.  Surgical clipping. During surgery, a clip is placed at the base of the aneurysm. The clip prevents blood from continuing to enter the  aneurysm. Ruptured Aneurysms Immediate emergency surgery may be needed to help prevent damage to the brain and to reduce the risk of rebleeding. Timing of treatment is an important factor in the prevention of complications. Successful early treatment of a ruptured aneurysm (within the first 3 days of a bleed) helps to prevent rebleeding and blood vessel spasm. In some cases, there may be a reason to treat later (10-14 days  after a rupture). Many things are considered when making this decision, and each case is handled individually. HOME CARE INSTRUCTIONS  Take medicines only as instructed by your health care provider.  Eat healthy foods. It is recommended that you eat 5 or more servings of fruits and vegetables each day. Foods may need to be a special consistency (soft or pureed), or small bites may need to be taken if you have had a ruptured aneurysm or stroke. Certain dietary changes may be advised to address high blood pressure, high cholesterol, diabetes, or obesity.  Food choices that are low in salt (sodium), saturated fat, trans fat, and cholesterol are recommended to manage high blood pressure.  Food choices that are high in fiber and low in saturated fat, trans fat, and cholesterol are recommended to control cholesterol levels.  Controlling carbohydrate and sugar intake is recommended to manage diabetes.  Reducing calorie intake and making food choices that are low in sodium, saturated fat, trans fat, and cholesterol are recommended to manage obesity.  Maintain a healthy weight.  Stay physically active. It is recommended that you get at least 30 minutes of activity on most or all days.  Do not smoke.  Limit alcohol use. Moderate alcohol use is considered to be:  No more than 2 drinks each day for men.  No more than 1 drink each day for nonpregnant women.  Stop drug abuse.  A safe home environment is important to reduce the risk of falls. Your health care provider may arrange  for specialists to evaluate your home. Having grab bars in the bedroom and bathroom is often important. Your health care provider may arrange for special equipment to be used at home, such as raised toilets and a seat for the shower.  Physical, occupational, and speech therapy. Ongoing therapy may be needed to maximize your recovery after a ruptured aneurysm or stroke. If you have been advised to use a walker or a cane, use it at all times. Be sure to keep your therapy appointments.  Follow all instructions for follow-up with your health care provider. This is very important. This includes any referrals, physical therapy, rehabilitation, and laboratory tests. Proper follow-up may prevent an aneurysm rupture or a stroke. SEEK IMMEDIATE MEDICAL CARE IF:  You have a sudden, severe headache with no known cause.  You have sudden nausea or vomiting with a severe headache.  You have sudden weakness or numbness of the face, arm, or leg, especially on one side of the body.  You have sudden trouble walking or difficulty moving arms or legs.  You have sudden confusion.  You have trouble speaking or understanding.  You have sudden trouble seeing in one or both eyes.  You have a sudden loss of balance or coordination.  You have a stiff neck.  You have difficulty breathing.  You have a partial or total loss of consciousness. Any of these symptoms may represent a serious problem that is an emergency. Do not wait to see if the symptoms will go away. Get medical help at once. Call your local emergency services (911 in [place] Do not drive yourself to the hospital. Document Released: 08/22/2002 Document Revised: 04/16/2014 Document Reviewed: 05/18/2013 [facility] Patient Information 2015 [place], [place]. This information is not intended to replace advice given to you by your health care provider. Make sure you discuss any questions you have with your health care provider.

## 2014-07-19 NOTE — Progress Notes (Signed)
Family Medicine Teaching Service Daily Progress Note Intern Pager: [phone]  Patient name: Diana Wells Medical record number: [id number] Date of birth: December 21, 1950 Age: 63 y.o. Gender: female  Primary Care Provider: No PCP Per Patient Consultants: Neurology, neurosurgery Code Status: DNR after discussion with patient   Pt Overview and Major Events to Date:  8/4: Pt presenting with confusion and aphasia x approximately 5-7 days. CT showed infarcts in the MCA and PCA distribution. 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 PMH is significant for hypertension, anxiety and tobacco abuse.  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  - Admit to neuro telemetry under FMTS, attending Dr. McDiarmidary to MCA and PCA infarcts. Unsure of the exact etiology in this patient, as she seems fairly healthy per her daughter. Head is atraumatic with no history of falls. Given her onset of symptoms, she is not a candidate for tPA. EKG in NSR on admission. Will assess for carotid atherosclerosis, arrhthymias, and heart failure as sources for possible emboli formation, however given carotid doppler findings this is most likely 2/2 large artery atherosclerosis.  - Neurology following, appreciate recs   - F/u neuro checks: stable  - Loaded on Plavix 300mg , will start clopidogrel 75mg  QD for secondary stroke prevention. - ASA 81mg   - Start Lipitor 80mg  for stroke prevention. - 10mm aneurysm noted on MRI/MRA, pt should follow up as an outpatient with Dr. Conchita Paris within a month for further evaluation/treatment: appt made - C/s speech, PT, and OT - Pt will need outpatient SLP: case management consulted   Grade II diastolic dysfunction: New diagnosis. Patient does not appear fluid  overloaded. Not on IVFs. - On lisinopril/HCTZ for HTN - Consider cardiology f/u as an outpatient.   Hypertension: Patient on atenolol as an outpatient, just restarted on 8/3.  -  Lisinopril/HCTZ  - Continue to monitor   Hypercholesterolemia: New diagnosis on this admission.  - Started Lipitor. - LDL goal <70  Hypokalemia: 3.6 on 8/5. Given KDUR .  - Repeat BMET pending   Lung crackles on exam: No SOB, satting well on RA. Given patient's history, COPD would not be unexpected. No fevers/chills or other upper respiratory symptoms.  Will continue to monitor O2 saturation  -CXR showing no acute process or malignancy  - May consider spirometry as an outpt to evaluate for COPD/emphysema   FEN/GI: Heart healthy diet/ NS IV lock Prophylaxis: SQ heparin   Disposition: Possible discharge today pending neuro recs.  Subjective:  Patient with no complaints overnight. Still having issues with word finding/aphasia however stable. Eager to go home.   Objective: Temp:  [97.7 F (36.5 C)-98.7 F (37.1 C)] 97.8 F (36.6 C) (08/06 0545) Pulse Rate:  [60-68] 60 (08/06 0545) Resp:  [18-20] 20 (08/06 0545) BP: (112-167)/(50-67) 112/50 mmHg (08/06 0545) SpO2:  [96 %-99 %] 99 % (08/06 0545) Physical Exam: General: 63 y/o female sitting up in bed in NAD with daughter and husband in the room. HEENT: Atraumatic, Normocephalic. PERRLA, EOMI, Oropharynx clear. MMM.  Cardiovascular: RRR, no m/r/g noted.  Respiratory: Crackles in the lungs bilaterally in the base>apex. No wheezing or rhonchi noted.  Breathing comfortably on RA. Abdomen: +BS. Soft ND/NT.  Extremities: No gross deformities noted. No edema  Skin: No rashes  Neuro: Alert and oriented.  Follows directions appropriately. Comprehension intact. Spontaneous language non-fluent with difficulties with word finding. Facial movements symmetric to smile and eyebrow rise. Facial sensation intact and equal. Grip strength nml and equal. UE and LE  strength 5/5 bilaterally. Gait not assessed.  Laboratory:  Recent Labs Lab 07/17/14 1902  WBC 11.3*  HGB 14.2  HCT 43.0  PLT 249    Recent Labs Lab 07/16/14 1336 07/17/14 1902  NA 141 137  K 4.0 3.6*  CL 104 99  CO2 26 23  BUN 8 9  CREATININE 0.70 0.69  CALCIUM 9.3 9.4  PROT 7.0 7.3  BILITOT 0.5 0.5  ALKPHOS 80 92  ALT 15 15  AST 14 16  GLUCOSE 124* 121*   Risk Stratification Labs  TSH No results found for this basename: tsh   Hemoglobin A1C    Component Value Date/Time   HGBA1C 6.3* 07/17/2014 1902   Lipid Panel     Component Value Date/Time   CHOL 260* 07/18/2014 0455   TRIG 168* 07/18/2014 0455   HDL 29* 07/18/2014 0455   CHOLHDL 9.0 07/18/2014 0455   VLDL 34 07/18/2014 0455   LDLCALC 197* 07/18/2014 0455   ISTAT Troponin 0.00   Imaging/Diagnostic Tests:   EKG 8/4: NSR. HR 82. Odd, sloping appearance of ST waves in II, III, and aVF with a return to baseline and upright T waves. T wave inversions in aVR and V1. QTc 434. No previous EKGs for comparisons   CT head 8/4: Hypodensities within the left basal ganglia and within the left occipital lobe which can be seen with age-indeterminate/likely subacute infarcts. There is mild expansion of the left basal ganglia with associated mass effect. As these represent different vascular distributions (MCA and PCA) recommend further evaluation with MRI/MRA.  MRI/MRA of the brain 8/4: MRI: 1. Multi focal acute ischemic left MCA territory ischemic infarcts involving the left basal ganglia and cortical and subcortical white matter of the left parietal and temporal lobes. No significant mass effect or evidence of hemorrhagic conversion. 2. Moderate chronic small vessel ischemic changes involving the supratentorial white matter and pons. MRA: 1. 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 2. Irregular 10 x 7.8 x 6.9 mm fusiform aneurysm involving the distal left  vertebral artery. 3. Moderate multi focal atherosclerotic irregularity within the cavernous segments of the internal carotid arteries bilaterally and left vertebral artery  CXR 8/5: No edema or consolidation. Atherosclerotic change with areas of calcification in the aorta and left carotid artery.  Echocardiogram 8/5: EF 55-60%. No regional wall motion abnormality. Grade II diastolic dysfunction.   Carotid Dopplers 8/5: Complex highly ulcerated plaque at the left carotid bifurcation. Short-segment proximal left ICA stenosis of up to 70 -75%. No other hemodynamically significant stenosis in the neck. Great vessel origin and right carotid bifurcation plaque. Dominant dolichoectatic left vertebral artery. 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 Non dominant right vertebral artery with soft and calcified plaque intracranially resulting in high-grade stenosis just proximal  to the right PICA origin, and then right vertebral artery occlusion distal to the PICA.   Joanna Puffrystal S Dorsey, MD 07/19/2014, 9:31 AM PGY-1, [facility] FPTS Intern pager: [phone], text pages welcome

## 2014-07-19 NOTE — Care Management Note (Addendum)
  Page 1 of 1   07/19/2014     2:34:04 PM CARE MANAGEMENT NOTE 07/19/2014  Patient:  Diana Wells,Diana Wells   Account Number:  [id number]  Date Initiated:  07/18/2014  Documentation initiated by:  Lorne Skeens  Subjective/Objective Assessment:   Patient was admitted with CVA.  Lives at home with spouse.     Action/Plan:   Will follow for discharge needs pending PT/OT evals and physician orders.   Anticipated DC Date:     Anticipated DC Plan:  [place]  CM consult  OP Neuro Rehab      Choice offered to / List presented to:             Status of service:  In process, will continue to follow Medicare Important Message given?   (If response is "NO", the following Medicare IM given date fields will be blank) Date Medicare IM given:   Medicare IM given by:   Date Additional Medicare IM given:   Additional Medicare IM given by:    Discharge Disposition:    Per UR Regulation:  Reviewed for med. necessity/level of care/duration of stay  If discussed at [place] of Stay Meetings, dates discussed:    Comments:  07/19/14 [place], MSN, CM- Met with patient and daughter to discuss outpatient speech therapy. Patient is agreeable and has chosen [facility]. Referral was faxed and patient was given written information.  Additional information was added to AVS.

## 2014-07-19 NOTE — Progress Notes (Signed)
Patient is discharged from room 4N07 at this time. Alert and in stable condition. IV site d/c'd as well as tele. Instructions read to patient and understanding verbalized. Ambulate off unit with family and belongings at side.

## 2014-07-19 NOTE — Progress Notes (Signed)
. Stroke Team Progress Note  HISTORY Diana Wells is an 63 y.o. female, right handed, with a past medical history significant for HTN, smoking, and anxiety, brought in by family 07/17/2014 for further evaluation of language impairment,confusion. She lives home with family and she tells that she was doing well until this past Thursday 07/12/2014 when she started noticing difficulty expressing herself, " I know what I want to say but can not get it completely out". In addition, she said that she has been a little bit confused and her daughter said that she doesn't recall her name. Denies associated HA, vertigo, double vision, difficulty swallowing, focal weakness or numbness, unsteadiness, or slurred speech. No CP, SOB, or palpitations. Daughter denied noticing face asymmetry. CT brain revealed hypodensities within the left basal ganglia and within the left occipital lobe. Patient was not administered TPA secondary to delay in arrival. She was admitted for further evaluation and treatment.  SUBJECTIVE Her daughter Diana Wells is at the bedside.  Patient feels she is stable, speech slightly improved. She does not like needles. She and her daughter had a lot of questions that were addressed by Dr. Roda ShuttersXu.  OBJECTIVE Most recent Vital Signs: Filed Vitals:   07/18/14 1829 07/18/14 2118 07/19/14 0104 07/19/14 0545  BP: 167/66 151/67 124/53 112/50  Pulse: 66 68 62 60  Temp: 97.9 F (36.6 C) 97.7 F (36.5 C) 97.8 F (36.6 C) 97.8 F (36.6 C)  TempSrc: Oral Oral Oral Oral  Resp: 20 20 20 20   Height:      Weight:      SpO2: 98% 98% 97% 99%   CBG (last 3)  No results found for this basename: GLUCAP,  in the last 72 hours  IV Fluid Intake:     MEDICATIONS  .  stroke: mapping our early stages of recovery book   Does not apply Once  . aspirin EC  81 mg Oral Daily  . atorvastatin  80 mg Oral q1800  . clopidogrel  75 mg Oral Daily  . heparin  5,000 Units Subcutaneous 3 times per day  . lisinopril  10 mg Oral  Daily   And  . hydrochlorothiazide  12.5 mg Oral Daily   PRN:  senna-docusate  Diet:  Cardiac thin liquids Activity:  OOB with assistance DVT Prophylaxis:  Heparin 5000 units sq tid   CLINICALLY SIGNIFICANT STUDIES Basic Metabolic Panel:   Recent Labs Lab 07/16/14 1336 07/17/14 1902  NA 141 137  K 4.0 3.6*  CL 104 99  CO2 26 23  GLUCOSE 124* 121*  BUN 8 9  CREATININE 0.70 0.69  CALCIUM 9.3 9.4   Liver Function Tests:   Recent Labs Lab 07/16/14 1336 07/17/14 1902  AST 14 16  ALT 15 15  ALKPHOS 80 92  BILITOT 0.5 0.5  PROT 7.0 7.3  ALBUMIN 4.7 4.4   CBC:   Recent Labs Lab 07/17/14 1902  WBC 11.3*  NEUTROABS 8.3*  HGB 14.2  HCT 43.0  MCV 88.8  PLT 249   Coagulation:   Recent Labs Lab 07/17/14 1902  LABPROT 13.6  INR 1.04   Cardiac Enzymes: No results found for this basename: CKTOTAL, CKMB, CKMBINDEX, TROPONINI,  in the last 168 hours Urinalysis: No results found for this basename: COLORURINE, APPERANCEUR, LABSPEC, PHURINE, GLUCOSEU, HGBUR, BILIRUBINUR, KETONESUR, PROTEINUR, UROBILINOGEN, NITRITE, LEUKOCYTESUR,  in the last 168 hours Lipid Panel    Component Value Date/Time   CHOL 260* 07/18/2014 0455   TRIG 168* 07/18/2014 0455   HDL 29*  07/18/2014 0455   CHOLHDL 9.0 07/18/2014 0455   VLDL 34 07/18/2014 0455   LDLCALC 197* 07/18/2014 0455   HgbA1C  Lab Results  Component Value Date   HGBA1C 6.3* 07/17/2014    Urine Drug Screen:   No results found for this basename: labopia,  cocainscrnur,  labbenz,  amphetmu,  thcu,  labbarb    Alcohol Level: No results found for this basename: ETH,  in the last 168 hours   CT of the brain  07/17/2014    Hypodensities within the left basal ganglia and within the left occipital lobe which can be seen with age-indeterminate/likely subacute infarcts. There is mild expansion of the left basal ganglia with associated mass effect. As these represent different vascular distributions (MCA and PCA) recommend further evaluation  with MRI/MRA.   MRI of the brain  07/18/2014    1. Multi focal acute ischemic left MCA territory ischemic infarcts involving the left basal ganglia and cortical and subcortical white matter of the left parietal and temporal lobes. No significant mass effect or evidence of hemorrhagic conversion. 2. Moderate chronic small vessel ischemic changes involving the supratentorial white matter and pons.   MRA of the brain  07/18/2014   1. 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 2. 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 3. Moderate multi focal atherosclerotic irregularity within the cavernous segments of the internal carotid arteries bilaterally and left vertebral artery.     CT Angio Neck  07/18/2014    1. Complex highly ulcerated plaque at the left carotid bifurcation. Short-segment proximal left ICA stenosis of up to 70 -75%. See sagittal image 134 of series 404. 2. No other hemodynamically significant stenosis in the neck. Great vessel origin and right carotid bifurcation plaque. 3. Dominant dolichoectatic left vertebral artery. 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 4. Non dominant right vertebral artery with soft and calcified plaque intracranially resulting in high-grade stenosis just proximal to the right PICA origin, and then right vertebral artery occlusion distal to the PICA.     Carotid Doppler  Bilateral: Vertebral artery flow is antegrade. Right = 1-39% ICA stenosis. Left = 60-79% ICA stenosis.  2D Echocardiogram  EF 55-60% with no source of embolus.   CXR  07/18/2014   No edema or consolidation. Atherosclerotic change with areas of calcification in the aorta and left carotid artery.   EKG  normal sinus rhythm, nonspecific ST and T waves changes. For complete results please see formal report.   Therapy Recommendations  out pt speech therapy, no PT  Physical Exam    Temp:  [97.7 F (36.5 C)-98.7 F (37.1 C)] 97.8 F (36.6 C) (08/06 0545) Pulse Rate:  [60-75] 60 (08/06 0545) Resp:  [18-20] 20 (08/06 0545) BP: (112-167)/(50-67) 112/50 mmHg (08/06 0545) SpO2:  [95 %-99 %] 99 % (08/06 0545)  General - Well nourished, well developed, in no apparent distress.  Ophthalmologic - Sharp disc margins OU.  Cardiovascular - Regular rate and rhythm with no murmur.  Mental Status -  Level of arousal and orientation to time, place, and person were intact. Language: non fluent aphasia, difficulty with expression, impaired on naming and repetition. Some improvement than yesterday  Cranial Nerves II - XII - II - Vision intact OU. III, IV, VI - Extraocular movements intact. V - Facial sensation intact bilaterally. VII - Facial movement  intact bilaterally. VIII - Hearing & vestibular intact bilaterally. X - Palate elevates symmetrically. XI - Chin turning & shoulder shrug intact bilaterally. XII - Tongue protrusion intact.  Motor Strength - The patient's strength was normal in all extremities. Mild RUE pronator drift. Bulk was normal and fasciculations were absent.  Left arm orbits right (mild, improved from yesterday)  Motor Tone - Muscle tone was assessed at the neck and appendages and was normal.  Reflexes - The patient's reflexes were normal in all extremities and she had no pathological reflexes.  Sensory - Light touch, temperature/pinprick, vibration and proprioception, and Romberg testing were assessed and were normal.    Coordination - The patient had normal movements in the hands and feet with no ataxia or dysmetria.  Tremor was absent.  Gait and Station - The patient's transfers, posture, gait, station, and turns were observed as normal.    ASSESSMENT Ms. Tzippy Testerman is a 63 y.o. female presenting with languauge impairment. Imaging confirms a patchy left MCA territory infarct in the setting of  high grade 80% L M1 stenosis and L ICA cavernous stenosis, confirmed with carotid ultrasound (doubt thrombus upon review of imaging) and CT angiogram neck. 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 Recommend VVS consult. On no antithrombotics prior to admission. Now on aspirin 81 mg orally every day and clopidogrel 75 mg orally every day following a plavix load for secondary stroke prevention. Patient with resultant expressive aphasia. Stroke work up completed.  Hypertension, BP 112-167/50-66 past 24h, on atenolol 50 mg daily Hyperlipidemia, LDL 197, on no statin PTA, now on high dose lipitor 80 mg daily, goal LDL < 70  HgbA1c 6.3, within the goal Smoker, just quit for 2 months.  L VA aneurysm. Dr. Roda Shutters discussed films with Dr. Conchita Paris who recommends follow up with him after discharge for further evaluation and possible treatment.  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 Likely need CEA. Vascular surgery called.   Hospital day # 2  TREATMENT/PLAN  Continue  aspirin 81 mg orally every day and clopidogrel 75 mg orally every day for secondary stroke prevention. (ordered)  OP ST  Continue High dose lipitor for stroke prevention  Continue to quit smoking.  VVS consult placed - ok for surgery this admission from stroke perspective  OP follow up with Dr. Conchita Paris for left VA aneurysm evaluation as an OP within a month  SHARON BIBY, MSN, RN, ANVP-BC, ANP-BC, [facility] Pager: [phone] 07/19/2014 8:13 AM   SIGNED I, the attending vascular neurologist, have personally obtained a history, examined the patient, evaluated laboratory data, individually viewed imaging studies, and formulated the assessment and plan of care.  I have made any additions or clarifications directly to the above note and agree with the findings and plan as currently documented.   Marvel Plan, MD PhD Stroke Neurology 07/19/2014 1:08 PM  To contact Stroke  Continuity provider, please refer to [email or web address]. After hours, contact General Neurology

## 2014-07-19 NOTE — Progress Notes (Signed)
FMTS ATTENDING  NOTE Diana Tokunaga,MD I  have seen and examined this patient, reviewed their chart. I have discussed this patient with the resident. I agree with the resident's findings, assessment and care plan. Patient still unable to express her words/ thoughts in words, this is upsetting to her, she denies any limb weakness, was ambulating on the floor without difficulty when I saw her. Had speech therapy yesterday, she might need this service after discharge as well. Continue Plavix, ASA and Lipitor. Vascular surg and neuro to follow patient for aneurysm and Left carotid ICA stenosis 70-75%. May benefit from surgical intervention at some point.

## 2014-07-20 ENCOUNTER — Telehealth: Payer: Self-pay | Admitting: Family Medicine

## 2014-07-20 ENCOUNTER — Telehealth: Payer: Self-pay

## 2014-07-20 ENCOUNTER — Other Ambulatory Visit: Payer: Self-pay | Admitting: *Deleted

## 2014-07-20 NOTE — Telephone Encounter (Signed)
Spoke to Daughter and gave [employer] information. Daughter is going to call them to see about living services, independent options etc.

## 2014-07-20 NOTE — Telephone Encounter (Signed)
I spoke with patient's daughter who is concern her mom does not trust her to care for her hence will prefer a home health aide assistant, she had gotten in touch with Anjulie's PCP who gave them contact to care [employer] in home, she will call to schedule appointment. I did mentioned she should work hand in hand with her pcp to get all referral paper work completed. She verbalized understanding.

## 2014-07-20 NOTE — Telephone Encounter (Signed)
Dr. Conley RollsLe    Family  Needs help in getting mom to some help.   Mom is fighting.  Has trouble speaking due to the stroke.   Kathreen Cosierhonda Tuggle   [phone]

## 2014-07-23 ENCOUNTER — Telehealth: Payer: Self-pay | Admitting: Neurology

## 2014-07-23 NOTE — Telephone Encounter (Signed)
Called daughter Diana Wells at [phone] and she stated that since discharge she has been pushing away family members who were trying to help her. She hide her medical informations and confused about her appointment but did not want anybody to help her. Finally, she went to see Dr. Conchita ParisNundkumar today for VA aneurysm but she did not let her family with her in the room so family does not know what Dr. Conchita ParisNundkumar said to her. She became irritated and easily agitated with personality changes. Daughter has safety concerns on her.  I called pt number [phone] but she did not pick up the phone. I then called pt's long-term partner, Diana Wells, at [phone], and he said that pt not listen to him and does not want to be helped. Pt had argument with daughter and currently ran out of house and wandering down the street. Diana Wells is trying to follow her and protect her. Diana Wells wants me to call him back in 30min and try to let me talk to the pt directly.

## 2014-07-23 NOTE — Telephone Encounter (Signed)
Talked with daughter and let her know I will have dr. Roda ShuttersXU call to explain a little then i can.

## 2014-07-23 NOTE — Telephone Encounter (Signed)
Talked with pt for more than 30 min over the phone. She still has partial expressive aphasia, and she sometimes not able to express herself well, but I was able to understand her after a long discussion. She stated that she knows her appointment and she has arranged her sister to send her for labs and surgery on Friday. She does not want her boyfriend or her daughter to be involved because they are very aggressive in terms of her care and she felt very anxious about them. Whenever, they are around her BP was high and she felt nervous. She does not want her boyfriend or daughter to be involved at this point. She denies any depression, suicidal ideation or attempts, homicidal ideation or attempts. She asked me not to talk to her daughter. She is fully orientated and had arrangement for her appointment and surgery. I did not feel there is safety issues at this time point.

## 2014-07-23 NOTE — Discharge Summary (Signed)
FMTS ATTENDING  NOTE Diana Joens,MD I  have seen and examined this patient, reviewed their chart. I have discussed this patient with the resident. I agree with the resident's findings, assessment and care plan. 

## 2014-07-23 NOTE — Telephone Encounter (Signed)
Diana HumBrenda Newsome DOB 12/14/1951-patient's daughter is calling-states patient was seen in the hospital with a stroke and is having issues of getting lost, her personality is changing and is pushing away family members or anyone to help her--please call--thank you

## 2014-07-24 ENCOUNTER — Other Ambulatory Visit (HOSPITAL_COMMUNITY): Payer: Self-pay | Admitting: *Deleted

## 2014-07-24 ENCOUNTER — Encounter (HOSPITAL_COMMUNITY): Payer: Self-pay | Admitting: Pharmacy Technician

## 2014-07-24 NOTE — Pre-Procedure Instructions (Addendum)
Diana Wells  07/24/2014   Your procedure is scheduled on:  Friday, August 03, 2014    [facility] Admitting Office at 530AM.   Call this number if you have problems the morning of surgery: [phone]   Remember:   Do not eat food or drink liquids after midnight Thursday, 08/02/14.   Take these medicines the morning of surgery with A SIP OF WATER: aspirin EC, loratadine (CLARITIN) - if needed     Take all meds as ordered until day of surgery except as instructed below or per dr     Despina AriasSTOP all herbel meds, nsaids (aleve,naproxen,advil,ibuprofen) 07/29/14 including vitamins            Do not wear jewelry, make-up or nail polish.  Do not wear lotions, powders, or perfumes. You may wear deodorant.  Do not shave 48 hours prior to surgery.   Do not bring valuables to the hospital.  [facility] not [facility]                  for any belongings or valuables.               Contacts, dentures or bridgework may not be worn into surgery.  Leave suitcase in the car. After surgery it may be brought to your room.  For patients admitted to the hospital, discharge time is determined by your                treatment team.   Special Instructions: [facility] - Preparing for Surgery  Before surgery, you can play an important role.  Because skin is not sterile, your skin needs to be as free of germs as possible.  You can reduce the number of germs on you skin by washing with CHG (chlorahexidine gluconate) soap before surgery.  CHG is an antiseptic cleaner which kills germs and bonds with the skin to continue killing germs even after washing.  Please DO NOT use if you have an allergy to CHG or antibacterial soaps.  If your skin becomes reddened/irritated stop using the CHG and inform your nurse when you arrive at Short Stay.  Do not shave (including legs and underarms) for at least 48 hours prior to the first CHG shower.  You may shave your face.  Please follow these  instructions carefully:   1.  Shower with CHG Soap the night before surgery and the                                morning of Surgery.  2.  If you choose to wash your hair, wash your hair first as usual with your       normal shampoo.  3.  After you shampoo, rinse your hair and body thoroughly to remove the                      Shampoo.  4.  Use CHG as you would any other liquid soap.  You can apply chg directly       to the skin and wash gently with scrungie or a clean washcloth.  5.  Apply the CHG Soap to your body ONLY FROM THE NECK DOWN.        Do not use on open wounds or open sores.  Avoid contact with your eyes, ears, mouth and genitals (private parts).  Wash genitals (private parts) with  your normal soap.  6.  Wash thoroughly, paying special attention to the area where your surgery        will be performed.  7.  Thoroughly rinse your body with warm water from the neck down.  8.  DO NOT shower/wash with your normal soap after using and rinsing off       the CHG Soap.  9.  Pat yourself dry with a clean towel.            10.  Wear clean pajamas.            11.  Place clean sheets on your bed the night of your first shower and do not        sleep with pets.  Day of Surgery  Do not apply any lotions the morning of surgery.  Please wear clean clothes to the hospital/surgery center.     Please read over the following fact sheets that you were given: Pain Booklet, Coughing and Deep Breathing, Blood Transfusion Information, MRSA Information and Surgical Site Infection Prevention

## 2014-07-25 ENCOUNTER — Other Ambulatory Visit: Payer: Self-pay | Admitting: *Deleted

## 2014-07-25 ENCOUNTER — Encounter (HOSPITAL_COMMUNITY): Payer: Self-pay

## 2014-07-25 ENCOUNTER — Encounter (HOSPITAL_COMMUNITY)
Admission: RE | Admit: 2014-07-25 | Discharge: 2014-07-25 | Disposition: A | Discharge status: Home / Self Care | Payer: 59 | Source: Ambulatory Visit | Attending: Surgery | Admitting: Surgery

## 2014-07-25 ENCOUNTER — Telehealth: Payer: Self-pay | Admitting: *Deleted

## 2014-07-25 DIAGNOSIS — I63239 Cerebral infarction due to unspecified occlusion or stenosis of unspecified carotid arteries: Secondary | ICD-10-CM

## 2014-07-25 DIAGNOSIS — Z87891 Personal history of nicotine dependence: Secondary | ICD-10-CM | POA: Diagnosis not present

## 2014-07-25 DIAGNOSIS — Z8673 Personal history of transient ischemic attack (TIA), and cerebral infarction without residual deficits: Secondary | ICD-10-CM | POA: Diagnosis not present

## 2014-07-25 DIAGNOSIS — Z01818 Encounter for other preprocedural examination: Secondary | ICD-10-CM | POA: Diagnosis not present

## 2014-07-25 DIAGNOSIS — Z01810 Encounter for preprocedural cardiovascular examination: Secondary | ICD-10-CM

## 2014-07-25 DIAGNOSIS — E785 Hyperlipidemia, unspecified: Secondary | ICD-10-CM | POA: Diagnosis not present

## 2014-07-25 DIAGNOSIS — Z01812 Encounter for preprocedural laboratory examination: Secondary | ICD-10-CM | POA: Insufficient documentation

## 2014-07-25 DIAGNOSIS — K219 Gastro-esophageal reflux disease without esophagitis: Secondary | ICD-10-CM | POA: Insufficient documentation

## 2014-07-25 DIAGNOSIS — I10 Essential (primary) hypertension: Secondary | ICD-10-CM | POA: Diagnosis not present

## 2014-07-25 HISTORY — DX: Peripheral vascular disease, unspecified: I73.9

## 2014-07-25 HISTORY — DX: Aphasia: R47.01

## 2014-07-25 HISTORY — DX: Cerebral infarction, unspecified: I63.9

## 2014-07-25 HISTORY — DX: Aneurysm of vertebral artery: I72.6

## 2014-07-25 HISTORY — DX: Gastro-esophageal reflux disease without esophagitis: K21.9

## 2014-07-25 LAB — PROTIME-INR
INR: 1.02 (ref 0.00–1.49)
Prothrombin Time: 13.4 seconds (ref 11.6–15.2)

## 2014-07-25 LAB — CBC
HCT: 43.4 % (ref 36.0–46.0)
Hemoglobin: 14.5 g/dL (ref 12.0–15.0)
MCH: 29.8 pg (ref 26.0–34.0)
MCHC: 33.4 g/dL (ref 30.0–36.0)
MCV: 89.3 fL (ref 78.0–100.0)
PLATELETS: 273 10*3/uL (ref 150–400)
RBC: 4.86 MIL/uL (ref 3.87–5.11)
RDW: 13 % (ref 11.5–15.5)
WBC: 13.4 10*3/uL — ABNORMAL HIGH (ref 4.0–10.5)

## 2014-07-25 LAB — SURGICAL PCR SCREEN
MRSA, PCR: NEGATIVE
Staphylococcus aureus: POSITIVE — AB

## 2014-07-25 LAB — COMPREHENSIVE METABOLIC PANEL
ALT: 29 U/L (ref 0–35)
AST: 22 U/L (ref 0–37)
Albumin: 4.5 g/dL (ref 3.5–5.2)
Alkaline Phosphatase: 97 U/L (ref 39–117)
Anion gap: 14 (ref 5–15)
BUN: 9 mg/dL (ref 6–23)
CALCIUM: 9.7 mg/dL (ref 8.4–10.5)
CO2: 25 mEq/L (ref 19–32)
Chloride: 92 mEq/L — ABNORMAL LOW (ref 96–112)
Creatinine, Ser: 0.69 mg/dL (ref 0.50–1.10)
GFR calc Af Amer: >90 mL/min (ref >90)
GFR calc non Af Amer: >90 mL/min (ref >90)
Glucose, Bld: 112 mg/dL — ABNORMAL HIGH (ref 70–99)
Potassium: 4.1 mEq/L (ref 3.7–5.3)
SODIUM: 131 meq/L — AB (ref 137–147)
TOTAL PROTEIN: 7.3 g/dL (ref 6.0–8.3)
Total Bilirubin: 0.5 mg/dL (ref 0.3–1.2)

## 2014-07-25 LAB — URINALYSIS, ROUTINE W REFLEX MICROSCOPIC
Bilirubin Urine: NEGATIVE
GLUCOSE, UA: NEGATIVE mg/dL
Hgb urine dipstick: NEGATIVE
KETONES UR: NEGATIVE mg/dL
NITRITE: NEGATIVE
PROTEIN: NEGATIVE mg/dL
Specific Gravity, Urine: 1.009 (ref 1.005–1.030)
Urobilinogen, UA: 0.2 mg/dL (ref 0.0–1.0)
pH: 7 (ref 5.0–8.0)

## 2014-07-25 LAB — ABO/RH: ABO/RH(D): A POS

## 2014-07-25 LAB — URINE MICROSCOPIC-ADD ON

## 2014-07-25 LAB — TYPE AND SCREEN
ABO/RH(D): A POS
Antibody Screen: NEGATIVE

## 2014-07-25 LAB — APTT: aPTT: 29 seconds (ref 24–37)

## 2014-07-25 NOTE — Progress Notes (Signed)
To continue aspirin and plavix but no plavix day of surgery per stephanie at [facility]

## 2014-07-25 NOTE — Progress Notes (Addendum)
Anesthesia Chart Review:  Patient is a 63 year old female scheduled for left CEA on 08/03/14 by Dr. Myra GianottiBrabham for symptomatic stenosis.   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 [facility] has instructed her to take Plavix up until the morning of surgery. Dr. Conchita ParisNundkumar is following her vertebral artery aneurysm (visit 07/25/14) and plans to further evaluate it following recovery from left CEA. She still has mild expressive aphasia. She has no current PCP, but is scheduled to follow-up with Dr. Hamilton Caprihao Le with [facility].  EKGs on 07/18/14 showed: NSR, non-specific ST abnormality. T wave abnormality was improved when compared to her 07/17/2014 that showed inferior ST/T wave abnormality--and to a lesser degree in lateral leads V4-6 (consider ischemia).  Troponin was negative.  No CV symptoms reported at PAT.  Echo on 07/18/14 showed: - Left ventricle: The cavity size was normal. Wall thickness was increased in a pattern of mild LVH. Systolic function was normal. The estimated ejection fraction was in the range of 55% to 60%. Wall motion was normal; there were no regional wall motion abnormalities. Features are consistent with a pseudonormal left ventricular filling pattern, with concomitant abnormal relaxation and increased filling pressure (grade 2 diastolic dysfunction). - Aortic valve: There was no stenosis. - Mitral valve: There was no significant regurgitation. - Left atrium: The atrium was at the upper limits of normal in size. - Right ventricle: The cavity size was normal. Systolic function was normal. - Tricuspid valve: Peak RV-RA gradient (S): 21 mm Hg. - Pulmonary arteries:  PA peak pressure: 24 mm Hg (S). - Inferior vena cava: The vessel was normal in size. The respirophasic diameter changes were in the normal range (>= 50%), consistent with normal central venous pressure.  MRI HEAD IMPRESSION:  1. Multi focal acute ischemic left MCA territory ischemic infarcts  involving the left basal ganglia and cortical and subcortical white  matter of the left parietal and temporal lobes. No significant mass  effect or evidence of hemorrhagic conversion.  2. Moderate chronic small vessel ischemic changes involving the  supratentorial white matter and pons.  MRA HEAD IMPRESSION:  1. Severe high-grade stenosis of at least 80% within the distal left  M1 segment, measuring approximately 7 mm in length.  2. Irregular 10 x 7.8 x 6.9 mm fusiform aneurysm involving the  distal left vertebral artery as above.  3. Moderate multi focal atherosclerotic irregularity within the  cavernous segments of the internal carotid arteries bilaterally and  left vertebral artery.  CTA of the neck on 07/18/14 showed:  IMPRESSION:  1. Complex highly ulcerated plaque at the left carotid bifurcation. Short-segment proximal left ICA stenosis of up to 70 -75%. See sagittal image 134 of series 404.  2. No other hemodynamically significant stenosis in the neck. Great vessel origin and right carotid bifurcation plaque.  3. Dominant dolichoectatic left vertebral artery. 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  4. Non dominant right vertebral artery with soft and calcified plaque intracranially resulting in high-grade stenosis just proximal to the right PICA origin, and then right vertebral artery occlusion distal to the PICA.  Carotid duplex on 07/18/14 showed:  - The vertebral arteries appear patent with antegrade  flow. - Findings consistent with 1-39 percent stenosis involving the right internal carotid artery. - Findings consistent with 60 - 79 percent  stenosis involving the left internal carotid artery.  CXR on 07/18/14 showed:No edema or consolidation. Atherosclerotic change with areas of calcification in the aorta and left carotid artery.    Preoperative labs noted.  Cr 0.81.  Non-fasting glucose 152.  WBC 13.4.  H/H 14.5/43.4. PT/PTT WNL.  I reviewed above with anesthesiologist Dr. Noreene Larsson.  Recommend preoperative cardiology evaluation due to abnormal EKG, grade II diastolic dysfunction with multiple CAD risk factors with need for left carotid surgery (and possible intervention for vertebral artery aneurysm in the near future).  I have notified Joyce Gross, RN at [facility].   Velna Ochs [facility] Short Stay Center/Anesthesiology Phone [phone] 07/25/2014 3:04 PM  Addendum: 07/31/2014 12:13 PM Patient seen by cardiologist Dr. Tenny Craw yesterday and felt to be "low risk for a major cardiac event.Marland KitchenMarland KitchenOK to proceed with surgery without further testing." She was also seen by her new PCP Dr. Conley Rolls yesterday for HTN follow-up. BP 152/82 adn 162/80 at her visits yesterday.

## 2014-07-25 NOTE — Telephone Encounter (Signed)
Diana Wells called and stated that this pt is needing a referral for her surgery, Carotid Endartrectomy.  Dr. Coral ElseVance Brabham is doing the surgery.  She stated you were her primary.  Surgery is schedule for the 08/03/14.    Can call Diana Wells back at [phone] ext [phone]

## 2014-07-26 ENCOUNTER — Other Ambulatory Visit: Payer: Self-pay | Admitting: Family Medicine

## 2014-07-26 DIAGNOSIS — I6529 Occlusion and stenosis of unspecified carotid artery: Secondary | ICD-10-CM

## 2014-07-26 NOTE — Telephone Encounter (Signed)
Please let [facility] know that referral is in for CEA, I am not her primary, only saw her once,  however she should get established with one.

## 2014-07-27 NOTE — Telephone Encounter (Signed)
Spoke to Diana Wells- advised referral has been entered. Advised to have pt RTC to establish care. Pt is going to try to come in today to see Dr. Conley Diana Wells.

## 2014-07-30 ENCOUNTER — Ambulatory Visit (INDEPENDENT_AMBULATORY_CARE_PROVIDER_SITE_OTHER): Payer: 59 | Admitting: Family Medicine

## 2014-07-30 ENCOUNTER — Ambulatory Visit (INDEPENDENT_AMBULATORY_CARE_PROVIDER_SITE_OTHER): Payer: 59 | Admitting: Internal Medicine

## 2014-07-30 ENCOUNTER — Encounter: Payer: Self-pay | Admitting: Internal Medicine

## 2014-07-30 VITALS — BP 152/82 | HR 93 | Temp 98.7°F | Resp 16 | Ht 60.0 in | Wt 159.0 lb

## 2014-07-30 VITALS — BP 162/80 | HR 85 | Ht 60.0 in | Wt 159.0 lb

## 2014-07-30 DIAGNOSIS — I6990 Unspecified sequelae of unspecified cerebrovascular disease: Secondary | ICD-10-CM

## 2014-07-30 DIAGNOSIS — I6350 Cerebral infarction due to unspecified occlusion or stenosis of unspecified cerebral artery: Secondary | ICD-10-CM

## 2014-07-30 DIAGNOSIS — I10 Essential (primary) hypertension: Secondary | ICD-10-CM

## 2014-07-30 DIAGNOSIS — E785 Hyperlipidemia, unspecified: Secondary | ICD-10-CM

## 2014-07-30 DIAGNOSIS — I6930 Unspecified sequelae of cerebral infarction: Secondary | ICD-10-CM

## 2014-07-30 DIAGNOSIS — Z01810 Encounter for preprocedural cardiovascular examination: Secondary | ICD-10-CM

## 2014-07-30 DIAGNOSIS — Z7189 Other specified counseling: Secondary | ICD-10-CM

## 2014-07-30 DIAGNOSIS — R4701 Aphasia: Secondary | ICD-10-CM

## 2014-07-30 DIAGNOSIS — Z7689 Persons encountering health services in other specified circumstances: Secondary | ICD-10-CM

## 2014-07-30 DIAGNOSIS — I639 Cerebral infarction, unspecified: Secondary | ICD-10-CM

## 2014-07-30 NOTE — Progress Notes (Signed)
HPI  Patient is a 63 yo who was recently admitted after suffering a CVA  Evaluation showed 70% L ICA stenosis.  Plan is for revascularization. The patient denies CP  Very active Denies SOB    Still having problems finding words   Allergies  Allergen Reactions  . Codeine Other (See Comments)    Makes patient incoherent and involuntarily perform acts that she does not remember.   . Erythromycin Itching and Rash    Hallucinations  . Penicillins Itching and Rash    Hallucinations     Current Outpatient Prescriptions  Medication Sig Dispense Refill  . aspirin EC 81 MG EC tablet Take 1 tablet (81 mg total) by mouth daily.  30 tablet  0  . atorvastatin (LIPITOR) 80 MG tablet Take 1 tablet (80 mg total) by mouth daily at 6 PM.  30 tablet  2  . clopidogrel (PLAVIX) 75 MG tablet Take 1 tablet (75 mg total) by mouth daily.  30 tablet  0  . hydrochlorothiazide (MICROZIDE) 12.5 MG capsule Take 1 capsule (12.5 mg total) by mouth daily.  30 capsule  1  . ibuprofen (ADVIL,MOTRIN) 200 MG tablet Take 200 mg by mouth every 12 (twelve) hours as needed for moderate pain.       Marland Kitchen lisinopril (PRINIVIL,ZESTRIL) 10 MG tablet Take 1 tablet (10 mg total) by mouth daily.  30 tablet  1  . loratadine (CLARITIN) 10 MG tablet Take 10 mg by mouth daily as needed for allergies.       No current facility-administered medications for this visit.    Past Medical History  Diagnosis Date  . Anxiety   . Hypertension   . Stroke     8/15 earlier in month  . Aphasia     slight aphasia  . GERD (gastroesophageal reflux disease)   . Peripheral vascular disease     ? in arm lft  . Vertebral artery aneurysm     left VA aneursym by 07/2014 MRA (further eval by Dr. Conchita Paris following L CEA surgery)    History reviewed. No pertinent past surgical history.  Family History  Problem Relation Age of Onset  . Heart failure Mother   . Hypertension Maternal Grandfather   . Heart attack Neg Hx     History   Social  History  . Marital Status: Divorced    Spouse Name: N/A    Number of Children: N/A  . Years of Education: N/A   Occupational History  . Not on file.   Social History Main Topics  . Smoking status: Former Smoker -- 0.25 packs/day for 47 years    Types: Cigarettes    Quit date: 07/19/2014  . Smokeless tobacco: Not on file  . Alcohol Use: No  . Drug Use: No  . Sexual Activity: Not on file   Other Topics Concern  . Not on file   Social History Narrative  . No narrative on file    Review of Systems:  All systems reviewed.  They are negative to the above problem except as previously stated.  Vital Signs: BP 162/80  Pulse 85  Ht 5' (1.524 m)  Wt 159 lb (72.122 kg)  BMI 31.05 kg/m2  SpO2 98%  Physical Exam Patinet in NAD  HEENT:  Normocephalic, atraumatic. EOMI, PERRLA.  Neck: JVP is normal.  No bruits.  Lungs: clear to auscultation. No rales no wheezes.  Heart: Regular rate and rhythm. Normal S1, S2. No S3.   No significant murmurs. PMI not  displaced.  Abdomen:  Supple, nontender. Normal bowel sounds. No masses. No hepatomegaly.  Extremities:   Good distal pulses throughout. No lower extremity edema.  Musculoskeletal :moving all extremities.  Neuro:   alert and oriented x3.  Otherwise deferred    EKG  8/5  SR 67 bpm  Nonspecific ST T wave changes   Assessment and Plan:  1.  Preop cardiac evaluation.  Plan for surgery..  From a cardiac standpoint I think she is a low risk for a major cardiac event  Active prior to CVA  No symptoms then or now of angina or CHF OK to proceed with surgery without further testing.    2.  CV disease  3.  Hyperlipidemia  Need to stay on statin.    4.  HTn  Will need to be followed after.

## 2014-07-30 NOTE — Progress Notes (Signed)
 Chief Complaint:  Chief Complaint  Patient presents with  . Follow-up    recheck hypertention    HPI: Diana Wells is a 63 y.o. female who is here to establish care. She was recently dc from the hospital with the following dx: Left MCA CVA, Left M1 stenosis , Left ICA stenosis of 70-75% , Left vertebral artery aneurysm , Grade II diastolic dysfunction , Hyperlidemia , Hypertension, Tobacco abuse  She has had a stroke with residual deficits of expressive aphasia, no obvious UE or  strength weakness She is scheduled for CEA on 8/21/with Dr York PellantBrabahm.  A neck CTA was performed which showed a 70% left carotid stenosis. Also an ultrasound showed 60-79% left carotid stenosis. Per dc notes the specialists felt that her arthersclerotic diseases was the cause of the CVA , she had no arrhythmias and echo showed no valvular abnormalities.   She is scheduled to get outpatient speech therapy tomorrow,  She has seen Cardiology today She has outpatient neurosurgery appt for further eval and management options for her aneurysm  She states she is doing well, there is a lot of stress currently but she states she has not gone back to smoking. She ahs quit since her stroke She states her boyfriend of 30 years and also her idaughter are adding onto her stress level, she denies being depressed or suicidal or homicidal She used to be a Emergency planning/management officerpolice officer, again she was a 1.5  ppd smoker since age [age], she was still smoking up to the time of her admission to the hospital but has since quit   Past Medical History  Diagnosis Date  . Anxiety   . Hypertension   . Stroke     8/15 earlier in month  . Aphasia     slight aphasia  . GERD (gastroesophageal reflux disease)   . Peripheral vascular disease     ? in arm lft  . Vertebral artery aneurysm     left VA aneursym by 07/2014 MRA (further eval by Dr. Conchita ParisNundkumar following L CEA surgery)   History reviewed. No pertinent past surgical history. History    Social History  . Marital Status: Divorced    Spouse Name: N/A    Number of Children: N/A  . Years of Education: N/A   Social History Main Topics  . Smoking status: Former Smoker -- 0.25 packs/day for 47 years    Types: Cigarettes    Quit date: 07/19/2014  . Smokeless tobacco: None  . Alcohol Use: No  . Drug Use: No  . Sexual Activity: None   Other Topics Concern  . None   Social History Narrative  . None   Family History  Problem Relation Age of Onset  . Heart failure Mother   . Hypertension Maternal Grandfather   . Heart attack Neg Hx    Allergies  Allergen Reactions  . Codeine Other (See Comments)    Makes patient incoherent and involuntarily perform acts that she does not remember.   . Erythromycin Itching and Rash    Hallucinations  . Penicillins Itching and Rash    Hallucinations    Prior to Admission medications   Medication Sig Start Date End Date Taking? Authorizing Provider  aspirin EC 81 MG EC tablet Take 1 tablet (81 mg total) by mouth daily. 07/19/14   Joanna Puffrystal S Dorsey, MD  atorvastatin (LIPITOR) 80 MG tablet Take 1 tablet (80 mg total) by mouth daily at 6 PM. 07/19/14   Crystal S  Leonides Schanz, MD  clopidogrel (PLAVIX) 75 MG tablet Take 1 tablet (75 mg total) by mouth daily. 07/19/14   Joanna Puff, MD  hydrochlorothiazide (MICROZIDE) 12.5 MG capsule Take 1 capsule (12.5 mg total) by mouth daily. 07/19/14   Joanna Puff, MD  ibuprofen (ADVIL,MOTRIN) 200 MG tablet Take 200 mg by mouth every 12 (twelve) hours as needed for moderate pain.     Historical Provider, MD  lisinopril (PRINIVIL,ZESTRIL) 10 MG tablet Take 1 tablet (10 mg total) by mouth daily. 07/19/14   Joanna Puff, MD  loratadine (CLARITIN) 10 MG tablet Take 10 mg by mouth daily as needed for allergies.    Historical Provider, MD     ROS: The patient denies fevers, chills, night sweats, unintentional weight loss, chest pain, palpitations, wheezing, dyspnea on exertion, nausea, vomiting, abdominal  pain, dysuria, hematuria, melena, numbness, weakness, or tingling.   All other systems have been reviewed and were otherwise negative with the exception of those mentioned in the HPI and as above.    PHYSICAL EXAM: Filed Vitals:   07/30/14 1414  BP: 152/82  Pulse: 93  Temp: 98.7 F (37.1 C)  Resp: 16   Filed Vitals:   07/30/14 1414  Height: 5' (1.524 m)  Weight: 159 lb (72.122 kg)   Body mass index is 31.05 kg/(m^2).  General: Alert, no acute distress HEENT:  Normocephalic, atraumatic, oropharynx patent. EOMI, PERRLA Cardiovascular:  Regular rate and rhythm, no rubs murmurs or gallops.  Radial pulse intact. No pedal edema.  Respiratory: Clear to auscultation bilaterally.  No wheezes, rales, or rhonchi.  No cyanosis, no use of accessory musculature GI: No organomegaly, abdomen is soft and non-tender, positive bowel sounds.  No masses. Skin: No rashes. Neurologic: Facial musculature symmetric. Difficulty finding wors, +Expressive aphasia, gross UE and  normal 5/5 strength, 2/2 DTRs Psychiatric: Patient is appropriate throughout our interaction. Lymphatic: No cervical lymphadenopathy Musculoskeletal: Gait intact.   LABS:  BP Readings from Last 3 Encounters:  07/30/14 152/82  07/30/14 162/80  07/25/14 157/93   Lab Results  Component Value Date   CHOL 260* 07/18/2014   Lab Results  Component Value Date   HDL 29* 07/18/2014   Lab Results  Component Value Date   LDLCALC 197* 07/18/2014   Lab Results  Component Value Date   TRIG 168* 07/18/2014   Lab Results  Component Value Date   CHOLHDL 9.0 07/18/2014   No results found for this basename: LDLDIRECT       EKG/XRAY:   Primary read interpreted by Dr. Conley Rolls at [facility].   ASSESSMENT/PLAN: Encounter Diagnoses  Name Primary?  . Encounter to establish care Yes  . Essential hypertension   . Other and unspecified hyperlipidemia   . Expressive aphasia    HTN-she recently was started on lisinopril 10 and HCTX 12.5 mg  daily, stopped atenolol . She is being followed by cardiology for this. Advise to measure BP and goal is < 150/90 for now Hyperlipidemia-will recheck labs in 1-2 months sicne now on high dose lipitor Expressive aphasia secondary to left MCA CVA-start speech therapy tomorrow Carotid stenosis-CEA scheduled for 08/03/14 Vertebral Aneurysm- F/u with neurosurgery Verne Spurr states she will be better once this is all done, she truly believes that her stress was a major contributing factor to the CVA even though we went through all her risk factors for CVA. She will let me know if her stress/anxiety worsens for further management.  F/u in 1.5 -2 months for labs  Gross sideeffects, risk  and benefits, and alternatives of medications d/w patient. Patient is aware that all medications have potential sideeffects and we are unable to predict every sideeffect or drug-drug interaction that may occur.  ,  PHUONG, DO 07/31/2014 8:15 AM

## 2014-07-30 NOTE — Patient Instructions (Addendum)
Your physician recommends that you continue on your current medications as directed. Please refer to the Current Medication list given to you today.  Your physician recommends that you schedule a follow-up appointment AS NEEDED WITH DR.ROSS.   

## 2014-08-02 MED ORDER — VANCOMYCIN HCL IN DEXTROSE 1-5 GM/200ML-% IV SOLN
1000.0000 mg | INTRAVENOUS | Status: AC
  Administered 2014-08-03: 1000 mg via INTRAVENOUS
  Filled 2014-08-02 (×2): qty 200

## 2014-08-03 ENCOUNTER — Encounter (HOSPITAL_COMMUNITY)
Admission: RE | Disposition: A | Discharge status: Home / Self Care | Payer: Self-pay | Source: Ambulatory Visit | Attending: Surgery

## 2014-08-03 ENCOUNTER — Encounter (HOSPITAL_COMMUNITY): Payer: 59 | Admitting: Vascular Surgery

## 2014-08-03 ENCOUNTER — Inpatient Hospital Stay (HOSPITAL_COMMUNITY): Payer: 59 | Admitting: Anesthesiology

## 2014-08-03 ENCOUNTER — Encounter (HOSPITAL_COMMUNITY): Payer: Self-pay | Admitting: *Deleted

## 2014-08-03 ENCOUNTER — Inpatient Hospital Stay (HOSPITAL_COMMUNITY)
Admission: RE | Admit: 2014-08-03 | Discharge: 2014-08-04 | DRG: 039 | Disposition: A | Discharge status: Home / Self Care | Payer: 59 | Source: Ambulatory Visit | Attending: Surgery | Admitting: Surgery

## 2014-08-03 ENCOUNTER — Ambulatory Visit: Payer: 59 | Admitting: Family Medicine

## 2014-08-03 DIAGNOSIS — Z7982 Long term (current) use of aspirin: Secondary | ICD-10-CM

## 2014-08-03 DIAGNOSIS — I10 Essential (primary) hypertension: Secondary | ICD-10-CM | POA: Diagnosis present

## 2014-08-03 DIAGNOSIS — Z833 Family history of diabetes mellitus: Secondary | ICD-10-CM

## 2014-08-03 DIAGNOSIS — Z8249 Family history of ischemic heart disease and other diseases of the circulatory system: Secondary | ICD-10-CM | POA: Diagnosis not present

## 2014-08-03 DIAGNOSIS — Z79899 Other long term (current) drug therapy: Secondary | ICD-10-CM | POA: Diagnosis not present

## 2014-08-03 DIAGNOSIS — F411 Generalized anxiety disorder: Secondary | ICD-10-CM | POA: Diagnosis present

## 2014-08-03 DIAGNOSIS — I69920 Aphasia following unspecified cerebrovascular disease: Secondary | ICD-10-CM

## 2014-08-03 DIAGNOSIS — K219 Gastro-esophageal reflux disease without esophagitis: Secondary | ICD-10-CM | POA: Diagnosis present

## 2014-08-03 DIAGNOSIS — Z881 Allergy status to other antibiotic agents status: Secondary | ICD-10-CM | POA: Diagnosis not present

## 2014-08-03 DIAGNOSIS — R21 Rash and other nonspecific skin eruption: Secondary | ICD-10-CM | POA: Diagnosis not present

## 2014-08-03 DIAGNOSIS — F17200 Nicotine dependence, unspecified, uncomplicated: Secondary | ICD-10-CM | POA: Diagnosis present

## 2014-08-03 DIAGNOSIS — I959 Hypotension, unspecified: Secondary | ICD-10-CM | POA: Diagnosis not present

## 2014-08-03 DIAGNOSIS — Z888 Allergy status to other drugs, medicaments and biological substances status: Secondary | ICD-10-CM

## 2014-08-03 DIAGNOSIS — Z791 Long term (current) use of non-steroidal anti-inflammatories (NSAID): Secondary | ICD-10-CM

## 2014-08-03 DIAGNOSIS — I6529 Occlusion and stenosis of unspecified carotid artery: Principal | ICD-10-CM | POA: Diagnosis present

## 2014-08-03 DIAGNOSIS — R1110 Vomiting, unspecified: Secondary | ICD-10-CM | POA: Diagnosis not present

## 2014-08-03 DIAGNOSIS — Z880 Allergy status to penicillin: Secondary | ICD-10-CM

## 2014-08-03 DIAGNOSIS — Z7902 Long term (current) use of antithrombotics/antiplatelets: Secondary | ICD-10-CM

## 2014-08-03 HISTORY — PX: ENDARTERECTOMY: SHX5162

## 2014-08-03 LAB — CBC
HCT: 35.9 % — ABNORMAL LOW (ref 36.0–46.0)
HEMOGLOBIN: 12.3 g/dL (ref 12.0–15.0)
MCH: 30 pg (ref 26.0–34.0)
MCHC: 34.3 g/dL (ref 30.0–36.0)
MCV: 87.6 fL (ref 78.0–100.0)
PLATELETS: 313 10*3/uL (ref 150–400)
RBC: 4.1 MIL/uL (ref 3.87–5.11)
RDW: 13.1 % (ref 11.5–15.5)
WBC: 16.1 10*3/uL — ABNORMAL HIGH (ref 4.0–10.5)

## 2014-08-03 LAB — CREATININE, SERUM
CREATININE: 0.65 mg/dL (ref 0.50–1.10)
GFR calc Af Amer: >90 mL/min (ref >90)
GFR calc non Af Amer: >90 mL/min (ref >90)

## 2014-08-03 SURGERY — ENDARTERECTOMY, CAROTID
Anesthesia: General | Site: Neck | Laterality: Left

## 2014-08-03 MED ORDER — MAGNESIUM SULFATE 40 MG/ML IJ SOLN: 2.0000 g | Freq: Every day | INTRAMUSCULAR | Status: DC | PRN

## 2014-08-03 MED ORDER — NEOSTIGMINE METHYLSULFATE 10 MG/10ML IV SOLN
INTRAVENOUS | Status: AC
  Filled 2014-08-03: qty 1

## 2014-08-03 MED ORDER — CHLORHEXIDINE GLUCONATE 4 % EX LIQD: 60.0000 mL | Freq: Once | CUTANEOUS | Status: DC

## 2014-08-03 MED ORDER — PROPOFOL 10 MG/ML IV BOLUS
INTRAVENOUS | Status: DC | PRN
  Administered 2014-08-03: 180 mg via INTRAVENOUS

## 2014-08-03 MED ORDER — CLOPIDOGREL BISULFATE 75 MG PO TABS
75.0000 mg | ORAL_TABLET | Freq: Every day | ORAL | Status: DC
  Administered 2014-08-04: 75 mg via ORAL
  Filled 2014-08-03: qty 1

## 2014-08-03 MED ORDER — ASPIRIN EC 81 MG PO TBEC
81.0000 mg | DELAYED_RELEASE_TABLET | Freq: Every day | ORAL | Status: DC
  Administered 2014-08-04: 81 mg via ORAL
  Filled 2014-08-03: qty 1

## 2014-08-03 MED ORDER — EPHEDRINE SULFATE 50 MG/ML IJ SOLN
INTRAMUSCULAR | Status: DC | PRN
  Administered 2014-08-03: 5 mg via INTRAVENOUS
  Administered 2014-08-03: 10 mg via INTRAVENOUS

## 2014-08-03 MED ORDER — HEPARIN SODIUM (PORCINE) 1000 UNIT/ML IJ SOLN
INTRAMUSCULAR | Status: DC | PRN
  Administered 2014-08-03: 1000 [IU] via INTRAVENOUS
  Administered 2014-08-03: 7000 [IU] via INTRAVENOUS

## 2014-08-03 MED ORDER — HYDROMORPHONE HCL PF 1 MG/ML IJ SOLN
INTRAMUSCULAR | Status: AC
  Filled 2014-08-03: qty 1

## 2014-08-03 MED ORDER — NEOSTIGMINE METHYLSULFATE 10 MG/10ML IV SOLN
INTRAVENOUS | Status: DC | PRN
  Administered 2014-08-03: 3 mg via INTRAVENOUS

## 2014-08-03 MED ORDER — SODIUM CHLORIDE 0.9 % IV SOLN
500.0000 mL | Freq: Once | INTRAVENOUS | Status: AC | PRN
  Administered 2014-08-03: 500 mL via INTRAVENOUS

## 2014-08-03 MED ORDER — IBUPROFEN 200 MG PO TABS
200.0000 mg | ORAL_TABLET | Freq: Two times a day (BID) | ORAL | Status: DC | PRN
  Filled 2014-08-03: qty 1

## 2014-08-03 MED ORDER — PROPOFOL 10 MG/ML IV BOLUS
INTRAVENOUS | Status: AC
  Filled 2014-08-03: qty 20

## 2014-08-03 MED ORDER — GLYCOPYRROLATE 0.2 MG/ML IJ SOLN
INTRAMUSCULAR | Status: DC | PRN
  Administered 2014-08-03: .6 mg via INTRAVENOUS

## 2014-08-03 MED ORDER — VANCOMYCIN HCL IN DEXTROSE 1-5 GM/200ML-% IV SOLN
1000.0000 mg | Freq: Two times a day (BID) | INTRAVENOUS | Status: AC
  Administered 2014-08-03 – 2014-08-04 (×2): 1000 mg via INTRAVENOUS
  Filled 2014-08-03 (×3): qty 200

## 2014-08-03 MED ORDER — POTASSIUM CHLORIDE CRYS ER 20 MEQ PO TBCR: 20.0000 meq | EXTENDED_RELEASE_TABLET | Freq: Every day | ORAL | Status: DC | PRN

## 2014-08-03 MED ORDER — PROTAMINE SULFATE 10 MG/ML IV SOLN
INTRAVENOUS | Status: AC
  Filled 2014-08-03: qty 5

## 2014-08-03 MED ORDER — PHENOL 1.4 % MT LIQD
1.0000 | OROMUCOSAL | Status: DC | PRN
  Filled 2014-08-03: qty 177

## 2014-08-03 MED ORDER — ALUM & MAG HYDROXIDE-SIMETH 200-200-20 MG/5ML PO SUSP: 15.0000 mL | ORAL | Status: DC | PRN

## 2014-08-03 MED ORDER — HYDROCHLOROTHIAZIDE 12.5 MG PO CAPS
12.5000 mg | ORAL_CAPSULE | Freq: Every day | ORAL | Status: DC
  Administered 2014-08-04: 12.5 mg via ORAL
  Filled 2014-08-03 (×2): qty 1

## 2014-08-03 MED ORDER — DOPAMINE-DEXTROSE 3.2-5 MG/ML-% IV SOLN
INTRAVENOUS | Status: AC
  Filled 2014-08-03: qty 250

## 2014-08-03 MED ORDER — SODIUM CHLORIDE 0.9 % IV SOLN
INTRAVENOUS | Status: DC
Start: 2014-08-03 — End: 2014-08-04

## 2014-08-03 MED ORDER — SODIUM CHLORIDE 0.9 % IR SOLN
Status: DC | PRN
  Administered 2014-08-03: 08:00:00

## 2014-08-03 MED ORDER — LIDOCAINE HCL (PF) 1 % IJ SOLN
INTRAMUSCULAR | Status: AC
  Filled 2014-08-03: qty 30

## 2014-08-03 MED ORDER — ATORVASTATIN CALCIUM 80 MG PO TABS
80.0000 mg | ORAL_TABLET | Freq: Every day | ORAL | Status: DC
  Filled 2014-08-03: qty 1

## 2014-08-03 MED ORDER — PHENYLEPHRINE HCL 10 MG/ML IJ SOLN
30.0000 ug/min | INTRAVENOUS | Status: DC
  Administered 2014-08-03: 30 ug/min via INTRAVENOUS
  Filled 2014-08-03: qty 1

## 2014-08-03 MED ORDER — METOPROLOL TARTRATE 1 MG/ML IV SOLN: 2.0000 mg | INTRAVENOUS | Status: DC | PRN

## 2014-08-03 MED ORDER — GUAIFENESIN-DM 100-10 MG/5ML PO SYRP: 15.0000 mL | ORAL_SOLUTION | ORAL | Status: DC | PRN

## 2014-08-03 MED ORDER — ONDANSETRON HCL 4 MG/2ML IJ SOLN
4.0000 mg | Freq: Once | INTRAMUSCULAR | Status: AC | PRN
  Administered 2014-08-03: 4 mg via INTRAVENOUS

## 2014-08-03 MED ORDER — FENTANYL CITRATE 0.05 MG/ML IJ SOLN
INTRAMUSCULAR | Status: DC | PRN
  Administered 2014-08-03: 50 ug via INTRAVENOUS
  Administered 2014-08-03: 125 ug via INTRAVENOUS
  Administered 2014-08-03: 25 ug via INTRAVENOUS

## 2014-08-03 MED ORDER — LACTATED RINGERS IV SOLN
INTRAVENOUS | Status: DC | PRN
  Administered 2014-08-03: 09:00:00 via INTRAVENOUS

## 2014-08-03 MED ORDER — PHENYLEPHRINE HCL 10 MG/ML IJ SOLN
INTRAMUSCULAR | Status: AC
  Filled 2014-08-03: qty 2

## 2014-08-03 MED ORDER — MORPHINE SULFATE 2 MG/ML IJ SOLN: 2.0000 mg | INTRAMUSCULAR | Status: DC | PRN

## 2014-08-03 MED ORDER — PROTAMINE SULFATE 10 MG/ML IV SOLN
INTRAVENOUS | Status: DC | PRN
  Administered 2014-08-03: 15 mg via INTRAVENOUS
  Administered 2014-08-03: 10 mg via INTRAVENOUS
  Administered 2014-08-03: 15 mg via INTRAVENOUS
  Administered 2014-08-03: 10 mg via INTRAVENOUS

## 2014-08-03 MED ORDER — OXYCODONE-ACETAMINOPHEN 5-325 MG PO TABS: 1.0000 | ORAL_TABLET | ORAL | Status: DC | PRN

## 2014-08-03 MED ORDER — HEMOSTATIC AGENTS (NO CHARGE) OPTIME
TOPICAL | Status: DC | PRN
  Administered 2014-08-03: 1 via TOPICAL

## 2014-08-03 MED ORDER — ACETAMINOPHEN 650 MG RE SUPP: 325.0000 mg | RECTAL | Status: DC | PRN

## 2014-08-03 MED ORDER — ONDANSETRON HCL 4 MG/2ML IJ SOLN
INTRAMUSCULAR | Status: AC
  Filled 2014-08-03: qty 2

## 2014-08-03 MED ORDER — PROMETHAZINE HCL 25 MG/ML IJ SOLN
INTRAMUSCULAR | Status: AC
  Administered 2014-08-03: 25 mg
  Filled 2014-08-03: qty 1

## 2014-08-03 MED ORDER — HYDRALAZINE HCL 20 MG/ML IJ SOLN: 10.0000 mg | INTRAMUSCULAR | Status: DC | PRN

## 2014-08-03 MED ORDER — SODIUM CHLORIDE 0.9 % IV SOLN
10.0000 mg | INTRAVENOUS | Status: DC | PRN
  Administered 2014-08-03: 50 ug/min via INTRAVENOUS

## 2014-08-03 MED ORDER — SODIUM CHLORIDE 0.9 % IV SOLN
INTRAVENOUS | Status: DC
  Administered 2014-08-03: 07:00:00 via INTRAVENOUS

## 2014-08-03 MED ORDER — ACETAMINOPHEN 325 MG PO TABS: 325.0000 mg | ORAL_TABLET | ORAL | Status: DC | PRN

## 2014-08-03 MED ORDER — SENNOSIDES-DOCUSATE SODIUM 8.6-50 MG PO TABS: 1.0000 | ORAL_TABLET | Freq: Every evening | ORAL | Status: DC | PRN

## 2014-08-03 MED ORDER — PHENYLEPHRINE HCL 10 MG/ML IJ SOLN: 30.0000 ug/min | INTRAVENOUS | Status: DC

## 2014-08-03 MED ORDER — METOCLOPRAMIDE HCL 5 MG/ML IJ SOLN
INTRAMUSCULAR | Status: AC
Start: 2014-08-03 — End: 2014-08-04
  Filled 2014-08-03: qty 2

## 2014-08-03 MED ORDER — PANTOPRAZOLE SODIUM 40 MG PO TBEC
40.0000 mg | DELAYED_RELEASE_TABLET | Freq: Every day | ORAL | Status: DC
  Administered 2014-08-03 – 2014-08-04 (×2): 40 mg via ORAL
  Filled 2014-08-03 (×2): qty 1

## 2014-08-03 MED ORDER — LIDOCAINE HCL (CARDIAC) 20 MG/ML IV SOLN
INTRAVENOUS | Status: DC | PRN
  Administered 2014-08-03: 100 mg via INTRAVENOUS

## 2014-08-03 MED ORDER — LABETALOL HCL 5 MG/ML IV SOLN: 10.0000 mg | INTRAVENOUS | Status: DC | PRN

## 2014-08-03 MED ORDER — ENOXAPARIN SODIUM 30 MG/0.3ML ~~LOC~~ SOLN
30.0000 mg | SUBCUTANEOUS | Status: DC
  Filled 2014-08-03 (×2): qty 0.3

## 2014-08-03 MED ORDER — HYDROMORPHONE HCL PF 1 MG/ML IJ SOLN
0.2500 mg | INTRAMUSCULAR | Status: DC | PRN
  Administered 2014-08-03: 0.5 mg via INTRAVENOUS

## 2014-08-03 MED ORDER — BISACODYL 10 MG RE SUPP: 10.0000 mg | Freq: Every day | RECTAL | Status: DC | PRN

## 2014-08-03 MED ORDER — 0.9 % SODIUM CHLORIDE (POUR BTL) OPTIME
TOPICAL | Status: DC | PRN
  Administered 2014-08-03 (×2): 1000 mL

## 2014-08-03 MED ORDER — ROCURONIUM BROMIDE 100 MG/10ML IV SOLN
INTRAVENOUS | Status: DC | PRN
  Administered 2014-08-03: 35 mg via INTRAVENOUS

## 2014-08-03 MED ORDER — DOPAMINE-DEXTROSE 3.2-5 MG/ML-% IV SOLN
3.0000 ug/kg/min | INTRAVENOUS | Status: DC
  Administered 2014-08-03: 5 ug/kg/min via INTRAVENOUS

## 2014-08-03 MED ORDER — METOCLOPRAMIDE HCL 5 MG/ML IJ SOLN
10.0000 mg | Freq: Four times a day (QID) | INTRAMUSCULAR | Status: DC | PRN
  Administered 2014-08-03: 10 mg via INTRAVENOUS

## 2014-08-03 MED ORDER — LISINOPRIL 10 MG PO TABS
10.0000 mg | ORAL_TABLET | Freq: Every day | ORAL | Status: DC
  Administered 2014-08-04: 10 mg via ORAL
  Filled 2014-08-03 (×2): qty 1

## 2014-08-03 MED ORDER — SODIUM CHLORIDE 0.9 % IV SOLN: INTRAVENOUS | Status: DC

## 2014-08-03 MED ORDER — DOCUSATE SODIUM 100 MG PO CAPS
100.0000 mg | ORAL_CAPSULE | Freq: Every day | ORAL | Status: DC
  Administered 2014-08-04: 100 mg via ORAL
  Filled 2014-08-03: qty 1

## 2014-08-03 MED ORDER — LORATADINE 10 MG PO TABS
10.0000 mg | ORAL_TABLET | Freq: Every day | ORAL | Status: DC | PRN
  Filled 2014-08-03: qty 1

## 2014-08-03 MED ORDER — SODIUM CHLORIDE 0.9 % IV SOLN
500.0000 mL | Freq: Once | INTRAVENOUS | Status: AC | PRN
  Administered 2014-08-03 (×2): 500 mL via INTRAVENOUS

## 2014-08-03 MED ORDER — ONDANSETRON HCL 4 MG/2ML IJ SOLN: 4.0000 mg | Freq: Four times a day (QID) | INTRAMUSCULAR | Status: DC | PRN

## 2014-08-03 MED ORDER — ONDANSETRON HCL 4 MG/2ML IJ SOLN
INTRAMUSCULAR | Status: DC | PRN
  Administered 2014-08-03: 4 mg via INTRAVENOUS

## 2014-08-03 MED ORDER — FENTANYL CITRATE 0.05 MG/ML IJ SOLN
INTRAMUSCULAR | Status: AC
  Filled 2014-08-03: qty 5

## 2014-08-03 MED ORDER — HEPARIN SODIUM (PORCINE) 1000 UNIT/ML IJ SOLN
INTRAMUSCULAR | Status: AC
  Filled 2014-08-03: qty 1

## 2014-08-03 SURGICAL SUPPLY — 53 items
BLADE SURG 10 STRL SS (BLADE) IMPLANT
CANISTER SUCTION 2500CC (MISCELLANEOUS) ×3 IMPLANT
CATH ROBINSON RED A/P 18FR (CATHETERS) ×3 IMPLANT
CATH SUCT 10FR WHISTLE TIP (CATHETERS) ×3 IMPLANT
CLIP TI MEDIUM 6 (CLIP) ×3 IMPLANT
CLIP TI WIDE RED SMALL 6 (CLIP) ×6 IMPLANT
CRADLE DONUT ADULT HEAD (MISCELLANEOUS) ×3 IMPLANT
DERMABOND ADVANCED (GAUZE/BANDAGES/DRESSINGS) ×2
DERMABOND ADVANCED .7 DNX12 (GAUZE/BANDAGES/DRESSINGS) ×1 IMPLANT
DRAIN CHANNEL 15F RND FF W/TCR (WOUND CARE) ×3 IMPLANT
ELECT REM PT RETURN 9FT ADLT (ELECTROSURGICAL) ×3
ELECTRODE REM PT RTRN 9FT ADLT (ELECTROSURGICAL) ×1 IMPLANT
EVACUATOR SILICONE 100CC (DRAIN) ×3 IMPLANT
GAUZE SPONGE 4X4 12PLY STRL (GAUZE/BANDAGES/DRESSINGS) IMPLANT
GLOVE BIOGEL PI IND STRL 6.5 (GLOVE) ×4 IMPLANT
GLOVE BIOGEL PI IND STRL 7.5 (GLOVE) ×2 IMPLANT
GLOVE BIOGEL PI INDICATOR 6.5 (GLOVE) ×8
GLOVE BIOGEL PI INDICATOR 7.5 (GLOVE) ×4
GLOVE ECLIPSE 6.5 STRL STRAW (GLOVE) ×9 IMPLANT
GLOVE SS BIOGEL STRL SZ 7 (GLOVE) ×1 IMPLANT
GLOVE SUPERSENSE BIOGEL SZ 7 (GLOVE) ×2
GLOVE SURG SS PI 6.5 STRL IVOR (GLOVE) ×3 IMPLANT
GLOVE SURG SS PI 7.5 STRL IVOR (GLOVE) ×3 IMPLANT
GOWN STRL REUS W/ TWL LRG LVL3 (GOWN DISPOSABLE) ×3 IMPLANT
GOWN STRL REUS W/ TWL XL LVL3 (GOWN DISPOSABLE) ×1 IMPLANT
GOWN STRL REUS W/TWL LRG LVL3 (GOWN DISPOSABLE) ×6
GOWN STRL REUS W/TWL XL LVL3 (GOWN DISPOSABLE) ×2
HEMOSTAT SNOW SURGICEL 2X4 (HEMOSTASIS) ×3 IMPLANT
INSERT FOGARTY SM (MISCELLANEOUS) IMPLANT
KIT BASIN OR (CUSTOM PROCEDURE TRAY) ×3 IMPLANT
KIT ROOM TURNOVER OR (KITS) ×3 IMPLANT
KIT SHUNT ARGYLE CAROTID ART 6 (VASCULAR PRODUCTS) ×3 IMPLANT
NS IRRIG 1000ML POUR BTL (IV SOLUTION) ×9 IMPLANT
PACK CAROTID (CUSTOM PROCEDURE TRAY) ×3 IMPLANT
PAD ARMBOARD 7.5X6 YLW CONV (MISCELLANEOUS) ×6 IMPLANT
PATCH VASCULAR VASCU GUARD 1X6 (Vascular Products) ×3 IMPLANT
PROBE PENCIL 8 MHZ STRL DISP (MISCELLANEOUS) ×3 IMPLANT
SHUNT CAROTID BYPASS 10 (VASCULAR PRODUCTS) IMPLANT
SHUNT CAROTID BYPASS 12FRX15.5 (VASCULAR PRODUCTS) IMPLANT
SPONGE GAUZE 4X4 12PLY STER LF (GAUZE/BANDAGES/DRESSINGS) ×3 IMPLANT
SPONGE INTESTINAL PEANUT (DISPOSABLE) ×6 IMPLANT
SUT ETHILON 3 0 PS 1 (SUTURE) ×3 IMPLANT
SUT PROLENE 6 0 BV (SUTURE) ×12 IMPLANT
SUT PROLENE 7 0 BV 1 (SUTURE) IMPLANT
SUT SILK 3 0 (SUTURE)
SUT SILK 3 0 TIES 17X18 (SUTURE)
SUT SILK 3-0 18XBRD TIE 12 (SUTURE) IMPLANT
SUT SILK 3-0 18XBRD TIE BLK (SUTURE) IMPLANT
SUT VIC AB 3-0 SH 27 (SUTURE) ×4
SUT VIC AB 3-0 SH 27X BRD (SUTURE) ×2 IMPLANT
SUT VICRYL 4-0 PS2 18IN ABS (SUTURE) ×3 IMPLANT
TAPE CLOTH SURG 4X10 WHT LF (GAUZE/BANDAGES/DRESSINGS) ×3 IMPLANT
WATER STERILE IRR 1000ML POUR (IV SOLUTION) ×3 IMPLANT

## 2014-08-03 NOTE — Progress Notes (Signed)
Report given to lauren reynolds rn as caregiver 

## 2014-08-03 NOTE — Transfer of Care (Signed)
Immediate Anesthesia Transfer of Care Note  Patient: Diana Wells  Procedure(s) Performed: Procedure(s): LEFT CAROTID ENDARTERECTOMY  WITH PATCH ANGIOPLASTY (Left)  Patient Location: PACU  Anesthesia Type:General  Level of Consciousness: awake, alert  and oriented  Airway & Oxygen Therapy: Patient Spontanous Breathing and Patient connected to nasal cannula oxygen  Post-op Assessment: Report given to PACU RN, Post -op Vital signs reviewed and stable, Patient moving all extremities X 4 and Patient able to stick tongue midline  Post vital signs: Reviewed and stable  Complications: No apparent anesthesia complications

## 2014-08-03 NOTE — Progress Notes (Signed)
Patient was requested for 2S but due to bed availability patients only ICU bed is 2H

## 2014-08-03 NOTE — Progress Notes (Signed)
pts bp dropped to 80,s systolic after dopamine stopped restarted at 361mcg/kg sys bp immediateley up to 180,s systolic and extreme nausea medicated with reglan 10mg  iv and dopamine stopped again

## 2014-08-03 NOTE — Anesthesia Preprocedure Evaluation (Addendum)
Anesthesia Evaluation  Patient identified by MRN, date of birth, ID band Patient awake    Reviewed: Allergy & Precautions  Airway Mallampati: II      Dental  (+) Teeth Intact, Dental Advidsory Given   Pulmonary former smoker,          Cardiovascular hypertension, + Peripheral Vascular Disease     Neuro/Psych Anxiety CVA, Residual Symptoms    GI/Hepatic GERD-  ,  Endo/Other    Renal/GU      Musculoskeletal   Abdominal   Peds  Hematology   Anesthesia Other Findings   Reproductive/Obstetrics                          Anesthesia Physical Anesthesia Plan  ASA: III  Anesthesia Plan: General   Post-op Pain Management:    Induction: Intravenous  Airway Management Planned: Oral ETT  Additional Equipment: Arterial line  Intra-op Plan:   Post-operative Plan: Extubation in OR  Informed Consent: I have reviewed the patients History and Physical, chart, labs and discussed the procedure including the risks, benefits and alternatives for the proposed anesthesia with the patient or authorized representative who has indicated his/her understanding and acceptance.   Dental Advisory Given  Plan Discussed with: CRNA and Anesthesiologist  Anesthesia Plan Comments:        Anesthesia Quick Evaluation

## 2014-08-03 NOTE — Progress Notes (Signed)
Pt remains extremely nauseated after phenergan given dr Myra Gianottibrabham here at bedside additional antiemetic ordered pt very sensitive to dopamine  Dopamine at 613mcg/kg and bp by aline 181/88 cuff bp 135/75 and 145/76 dr Myra Gianottibrabham ordered to go by cuff pressures..dopamine d/c,d for present will monitor bp

## 2014-08-03 NOTE — H&P (View-Only) (Signed)
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  She was unable to recall her daughters name.  She denise amaurosis fugax, no dysphasia, and no weakness.  Her medical history is positive for hypertension treated with atenolol and lisinopril..  She denise hypercholesterolemia, however she is now currently taking Lipitor.  She also denied DM and CAD.  She was loaded with Plavix,  She does have a smoking history and quit > 2 years ago.  A neck CTA was performed which showed a 70% left carotid stenosis.  Also an ultrasound showed 60-79% left carotid stenosis.  MRI reveals left brain infarct as well as a MCA M1 high-grade stenosis and aneurysm which neurosurgery will be evaluating. We have been asked to consult regarding left ICA stenosis 70-75% and expressive aphasia.        Current Facility-Administered Medications  Medication Dose Route Frequency Provider Last Rate Last Dose  .  stroke: mapping our early stages of recovery book   Does not apply Once Elenora Gamma, MD      . aspirin EC tablet 81 mg  81 mg Oral Daily Lunette Stands, MD   81 mg at 07/19/14 1059  . atorvastatin (LIPITOR) tablet 80 mg  80 mg Oral q1800 Elenora Gamma, MD   80 mg at 07/18/14 1735  . clopidogrel (PLAVIX) tablet 75 mg  75 mg Oral Daily Layne Benton, NP   75 mg at 07/19/14 1059  . heparin injection 5,000 Units  5,000 Units Subcutaneous 3 times per day Elenora Gamma, MD   5,000 Units at 07/19/14 [phone]  . lisinopril (PRINIVIL,ZESTRIL) tablet 10 mg  10 mg Oral Daily Ashly M Gottschalk, DO   10 mg at 07/19/14 1059   And  . hydrochlorothiazide (MICROZIDE) capsule 12.5 mg  12.5 mg Oral Daily Ashly M Gottschalk, DO   12.5 mg at 07/19/14  1059  . senna-docusate (Senokot-S) tablet 1 tablet  1 tablet Oral QHS PRN Elenora Gamma, MD        Pt meds include: Statin :Yes Betablocker: Yes ASA: Yes Other anticoagulants/antiplatelets: Plavix  Past Medical History  Diagnosis Date  . Anxiety   . Hypertension     History reviewed. No pertinent past surgical history.  Social History History  Substance Use Topics  . Smoking status: Light Tobacco Smoker  . Smokeless tobacco: Not on file  . Alcohol Use: No    Family History Family history: Mother DM, HTN, CHF Father DM  Allergies  Allergen Reactions  . Codeine Other (See Comments)    Makes patient incoherent and involuntarily perform acts that she does not remember.   . Erythromycin Itching and Rash    Hallucinations  . Penicillins Itching and Rash    Hallucinations      REVIEW OF SYSTEMS  General: [ ]  Weight loss, [ ]  Fever, [ ]  chills Neurologic: [ ]  Dizziness, [ ]  Blackouts, [ ]  Seizure [x ] Stroke, [ ]  "Mini stroke", [ ]  Slurred speech, [ ]  Temporary blindness; [ ]  weakness in arms or legs, [ ]  Hoarseness [ ]  Dysphagia [x]  aphasia ] Cardiac: [ ]  Chest pain/pressure, [ ]  Shortness of breath at rest [ ]  Shortness of breath with exertion, [ ]   Atrial fibrillation or irregular heartbeat  Vascular: [ ]  Pain in legs with walking, [ ]  Pain in legs at rest, [ ]  Pain in legs at night,  [ ]  Non-healing ulcer, [ ]  Blood clot in vein/DVT,   Pulmonary: [ ]  Home oxygen, [ ]  Productive cough, [ ]  Coughing up blood, [ ]  Asthma,  [ ]  Wheezing [ ]  COPD Musculoskeletal:  [ ]  Arthritis, [ ]  Low back pain, [ ]  Joint pain Hematologic: [ ]  Easy Bruising, [ ]  Anemia; [ ]  Hepatitis Gastrointestinal: [ ]  Blood in stool, [ ]  Gastroesophageal Reflux/heartburn, Urinary: [ ]  chronic Kidney disease, [ ]  on HD - [ ]  MWF or [ ]  TTHS, [ ]  Burning with urination, [ ]  Difficulty urinating Skin: [ ]  Rashes, [ ]  Wounds Psychological: [ ]  Anxiety, [ ]  Depression  Physical  Examination Filed Vitals:   07/18/14 2118 07/19/14 0104 07/19/14 0545 07/19/14 1037  BP: 151/67 124/53 112/50 147/83  Pulse: 68 62 60 73  Temp: 97.7 F (36.5 C) 97.8 F (36.6 C) 97.8 F (36.6 C) 98 F (36.7 C)  TempSrc: Oral Oral Oral Oral  Resp: 20 20 20 20   Height:      Weight:      SpO2: 98% 97% 99% 95%   Body mass index is 31.58 kg/(m^2).  General:  WDWN in NAD Gait: Normal HENT: WNL Eyes: Pupils equal Pulmonary: normal non-labored breathing , without Rales, rhonchi,  wheezing Cardiac: RRR, without  Murmurs, rubs or gallops; No carotid bruits Abdomen: soft, NT, no masses Skin: no rashes, ulcers noted;  no Gangrene , no cellulitis; no open wounds;   Vascular Exam/Pulses:Palpable radial, brachial, femoral DP/PT pulses all 2+ and equal.   Musculoskeletal: no muscle wasting or atrophy; no edema  Neurologic: A&O X 3; Appropriate Affect ;  SENSATION: normal; MOTOR FUNCTION: 5/5 Symmetric  No weakness noted Speech is fluent/normal   Significant Diagnostic Studies: CBC Lab Results  Component Value Date   WBC 11.3* 07/17/2014   HGB 14.2 07/17/2014   HCT 43.0 07/17/2014   MCV 88.8 07/17/2014   PLT 249 07/17/2014    BMET    Component Value Date/Time   NA 139 07/19/2014 0905   K 4.8 07/19/2014 0905   CL 102 07/19/2014 0905   CO2 24 07/19/2014 0905   GLUCOSE 152* 07/19/2014 0905   BUN 13 07/19/2014 0905   CREATININE 0.81 07/19/2014 0905   CREATININE 0.70 07/16/2014 1336   CALCIUM 9.6 07/19/2014 0905   GFRNONAA 76* 07/19/2014 0905   GFRNONAA >89 07/16/2014 1336   GFRAA 88* 07/19/2014 0905   GFRAA >89 07/16/2014 1336   Estimated Creatinine Clearance: 64.3 ml/min (by C-G formula based on Cr of 0.81).  COAG Lab Results  Component Value Date   INR 1.04 07/17/2014     Non-Invasive Vascular Imaging:  CTA neck:IMPRESSION:  1. Complex highly ulcerated plaque at the left carotid bifurcation.  Short-segment proximal left ICA stenosis of up to 70 -75%. See  sagittal image 134 of series 404.  2.  No other hemodynamically significant stenosis in the neck. Great  vessel origin and right carotid bifurcation plaque.  3. Dominant dolichoectatic left vertebral artery. 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  4. Non dominant right vertebral artery with soft and calcified  plaque intracranially resulting in high-grade stenosis just proximal  to the right PICA origin, and then right vertebral artery occlusion  distal to the PICA.    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 She has stopped smoking and is now on maximum medical management with the statins, plavix and beta blockers. Dr. Myra GianottiBrabham will discuss the surgical plan with her and her family     Clinton GallantCOLLINS, Diana Wells [facility] AssociatesMAUREEN 07/19/2014 11:11 AM  I agree with the above.  I have seen and examined the patient with her daughter in the room.  She has symptomatic left ICA stenosis, documented by u/s and CTA.  She is still having trouble with word finding.  I discussed that I feel it is best to delay her carotid endarterectomy until next week.  I discussed the risk and benefits of surgery, including the risk of stroke, nerve injury and bleeding.  She can continue her palvix.  She is scheduled to see neurosurgery as an outpatient regarding the findings on MRI.  I spent >45 minutes discussing this with the patient.  Durene CalWells Oluwadarasimi Favor

## 2014-08-03 NOTE — Anesthesia Postprocedure Evaluation (Signed)
  Anesthesia Post-op Note  Patient: Diana Wells  Procedure(s) Performed: Procedure(s): LEFT CAROTID ENDARTERECTOMY  WITH PATCH ANGIOPLASTY (Left)  Patient Location: PACU  Anesthesia Type:General  Level of Consciousness: awake, alert , oriented and patient cooperative  Airway and Oxygen Therapy: Patient Spontanous Breathing  Post-op Pain: mild  Post-op Assessment: Post-op Vital signs reviewed, Patient's Cardiovascular Status Stable, Respiratory Function Stable, Patent Airway, No signs of Nausea or vomiting and Pain level controlled  Post-op Vital Signs: stable  Last Vitals:  Filed Vitals:   08/03/14 1215  BP: 151/49  Pulse: 58  Temp:   Resp: 18    Complications: No apparent anesthesia complications

## 2014-08-03 NOTE — Interval H&P Note (Signed)
History and Physical Interval Note:  08/03/2014 7:07 AM  Diana Wells  has presented today for surgery, with the diagnosis of Occlusion and stenosis of carotid artery with cerebral infarction   The various methods of treatment have been discussed with the patient and family. After consideration of risks, benefits and other options for treatment, the patient has consented to  Procedure(s): ENDARTERECTOMY CAROTID (Left) as a surgical intervention .  The patient's history has been reviewed, patient examined, no change in status, stable for surgery.  I have reviewed the patient's chart and labs.  Questions were answered to the patient's satisfaction.     BRABHAM IV, V. WELLS

## 2014-08-04 LAB — CBC
HEMATOCRIT: 33.9 % — AB (ref 36.0–46.0)
Hemoglobin: 11.3 g/dL — ABNORMAL LOW (ref 12.0–15.0)
MCH: 29.4 pg (ref 26.0–34.0)
MCHC: 33.3 g/dL (ref 30.0–36.0)
MCV: 88.1 fL (ref 78.0–100.0)
Platelets: 239 10*3/uL (ref 150–400)
RBC: 3.85 MIL/uL — AB (ref 3.87–5.11)
RDW: 13.3 % (ref 11.5–15.5)
WBC: 10.6 10*3/uL — AB (ref 4.0–10.5)

## 2014-08-04 LAB — BASIC METABOLIC PANEL
Anion gap: 11 (ref 5–15)
BUN: 6 mg/dL (ref 6–23)
CHLORIDE: 104 meq/L (ref 96–112)
CO2: 23 mEq/L (ref 19–32)
Calcium: 8.5 mg/dL (ref 8.4–10.5)
Creatinine, Ser: 0.67 mg/dL (ref 0.50–1.10)
GFR calc Af Amer: >90 mL/min (ref >90)
GFR calc non Af Amer: >90 mL/min (ref >90)
GLUCOSE: 97 mg/dL (ref 70–99)
Potassium: 4.3 mEq/L (ref 3.7–5.3)
SODIUM: 138 meq/L (ref 137–147)

## 2014-08-04 NOTE — Progress Notes (Signed)
Patient stated that she wanted to wait until she's discharged home.  She was informed that if she changed her mind that she can let us know.

## 2014-08-04 NOTE — Progress Notes (Addendum)
     Subjective  - Doing well.  Voided several times and ambulated in the room.     Objective 123/49 71 98.5 F (36.9 C) (Oral) 17 94%  Intake/Output Summary (Last 24 hours) at 08/04/14 0747 Last data filed at 08/04/14 0500  Gross per 24 hour  Intake 2930.69 ml  Output   1621 ml  Net 1309.69 ml    Palpable radial pulses equal bil. Expressive aphasia is present, but somewhat improved from 2 weeks ago. Move all extremities, grip 5/5 No facial droop and no tongue deviation Incision is C/D/I Drain removal site dry 4x4 placed  Assessment/Planning: POD #1left carotid endarterectomy S/P CVA expressive aphasia Pain well controlled on PO medications Vasopressors stopped at 2200 stable BP over night 125/52 No N/V  D/C home   Clinton Gallant [facility] 08/04/2014 7:47 AM --  Laboratory Lab Results:  Recent Labs  08/03/14 1915 08/04/14 0455  WBC 16.1* 10.6*  HGB 12.3 11.3*  HCT 35.9* 33.9*  PLT 313 239   BMET  Recent Labs  08/03/14 1915 08/04/14 0455  NA  --  138  K  --  4.3  CL  --  104  CO2  --  23  GLUCOSE  --  97  BUN  --  6  CREATININE 0.65 0.67  CALCIUM  --  8.5    COAG Lab Results  Component Value Date   INR 1.02 07/25/2014   INR 1.04 07/17/2014   No results found for this basename: PTT      Neuro intact Stable For d/c  Wells Brabham,

## 2014-08-06 ENCOUNTER — Encounter (HOSPITAL_COMMUNITY): Payer: Self-pay | Admitting: Surgery

## 2014-08-06 ENCOUNTER — Telehealth: Payer: Self-pay | Admitting: Surgery

## 2014-08-06 ENCOUNTER — Telehealth: Payer: Self-pay

## 2014-08-06 NOTE — Telephone Encounter (Addendum)
Message copied by Rosalyn Charters on Mon Aug 06, 2014 11:21 AM ------      Message from: Lorin Mercy K      Created: Mon Aug 06, 2014  9:13 AM      Regarding: Schedule                   ----- Message -----         From: Lars Mage, PA-C         Sent: 08/04/2014   7:53 AM           To: Vvs Charge Pool            F/U in 2 weeks with Dr. Myra Gianotti s/p carotid endarterectomy. ------  notified patient of post op appt. with dr. Myra Gianotti on 08-27-14  10am

## 2014-08-06 NOTE — Telephone Encounter (Signed)
[facility] neuro office is calling asking for out patient speech therapy  For CVA referral

## 2014-08-06 NOTE — Op Note (Signed)
Patient name: Diana Wells MRN: [id number] DOB: 10/20/1951 Sex: female  08/03/2014 Pre-operative Diagnosis: Symptomatic   left carotid stenosis Post-operative diagnosis:  Same Surgeon:  Jorge Ny Assistants:  Lianne Cure Procedure:    left carotid Endarterectomy with bovine pericardial    patch angioplasty Anesthesia:  General Blood Loss:  See anesthesia record Specimens:  Carotid Plaque to pathology  Findings:  80 %stenosis; Thrombus:   none  Indications:  Patient suffered a left brain stroke.  During her workup she was found to have a high-grade left carotid stenosis.  She comes in today for endarterectomy.  She has had an improvement in her symptoms which mainly consisted of expressive aphasia.  Procedure:  The patient was identified in the holding area and taken to [facility] OR ROOM 12  The patient was then placed supine on the table.   General endotrachial anesthesia was administered.  The patient was prepped and draped in the usual sterile fashion.  A time out was called and antibiotics were administered.  The incision was made along the anterior border of the left sternocleidomastoid muscle.  Cautery was used to dissect through the subcutaneous tissue.  The platysma muscle was divided with cautery.  The internal jugular vein was exposed along its anterior medial border.  The common facial vein was exposed and then divided between 2-0 silk ties and metal clips.  The common carotid artery was then circumferentially exposed and encircled with an umbilical tape.  The vagus nerve was identified and protected.  Next sharp dissection was used to expose the external carotid artery and the superior thyroid artery.  The were encircled with a blue vessel loop and a 2-0 silk tie respectively.  Finally, the internal carotid was carefully dissected free.  An umbilical tape was placed around the internal carotid artery distal to the diseased segment.  The hypoglossal nerve was visualized throughout  and protected.  The patient was given systemic heparinization.  A bovine carotid patch was selected and prepared on the back table.  A 10 french shunt was also prepared.  After blood pressure readings were appropriate and the heparin had been given time to circulate, the internal carotid artery was occluded with a baby Gregory clamp.  The external and common carotid arteries were then occluded with vascular clamps and the 2-0 tie tightened on the superior thyroid artery.  A #11 blade was used to make an arteriotomy in the common carotid artery.  This was extended with Potts scissors along the anterior and lateral border of the common and internal carotid artery.  Approximately 80% stenosis was identified.  There was no thrombus identified.  The 8 french shunt was then placed.  A kleiner kuntz elevator was used to perform endarterectomy.  An eversion endarterectomy was performed in the external carotid artery.  A good distal endpoint was obtained in the internal carotid artery.  The specimen was removed and sent to pathology.  Heparinized saline was used to irrigate the endarterectomized field.  All potential embolic debris was removed.  Bovine pericardial patch angioplasty was then performed using a running 6-0 Prolene. Just prior to completion of the repair, the shunt was removed. The common internal and external carotid arteries were all appropriately flushed. The artery was again irrigated with heparin saline.  The anastomosis was then secured. The clamp was first released on the external carotid artery followed by the common carotid artery approximately 30 seconds later, bloodflow was reestablish through the internal carotid artery.  Next, a  hand-held  Doppler was used to evaluate the signals in the common, external, and internal  carotid arteries, all of which had appropriate signals. I then administered  50 mg protamine. The wound was then irrigated.  After hemostasis was achieved, the carotid sheath was  reapproximated with 3-0 Vicryl. The  platysma muscle was reapproximated with running 3-0 Vicryl. The skin  was closed with 4-0 Vicryl. Dermabond was placed on the skin. The  patient was then successfully extubated. Her neurologic exam was  similar to his preprocedural exam. The patient was then taken to recovery room  in stable condition. There were no complications.     Disposition:  To PACU in stable condition.  Relevant Operative Details:  The patient had a ulceration at the carotid bifurcation which appeared to be flow-limiting with approximately 80% stenosis.  She had very poor back bleeding, and therefore I placed an 8 [place] shunt, as I could not insert a 10 [place] shunt.  She had normal anatomy  V. Durene Cal, M.D. [facility] [place] Office: [phone] Pager:  [phone]

## 2014-08-07 NOTE — Discharge Summary (Signed)
Patient ID:  Diana Wells MRN: [id number] DOB/AGE: June 15, 1951 63 y.o.  Admit date: 08/03/2014 Discharge date: 08/07/2014 Date of Surgery: 08/03/2014 Surgeon: Surgeon(s): Nada Libman, MD  Admission Diagnosis: Occlusion and stenosis of carotid artery with cerebral infarction   Discharge Diagnoses:  Occlusion and stenosis of carotid artery with cerebral infarction   Secondary Diagnoses: Past Medical History  Diagnosis Date  . Anxiety   . Hypertension   . Stroke     8/15 earlier in month  . Aphasia     slight aphasia  . GERD (gastroesophageal reflux disease)   . Peripheral vascular disease     ? in arm lft  . Vertebral artery aneurysm     left VA aneursym by 07/2014 MRA (further eval by Dr. Conchita Paris following L CEA surgery)    Procedure(s): LEFT CAROTID ENDARTERECTOMY  WITH PATCH ANGIOPLASTY  Discharged Condition: good  HPI: History of Present Illness: 63 y/o female that reports having difficulty with speech and getting her words "out" that started Thursday. She was unable to recall her daughters name. She denise amaurosis fugax, no dysphasia, and no weakness.  She had multiple left sided cerebral infarctions with expressive language impairment. 07/17/2014.       Hospital Course:  Diana Wells is a 63 y.o. female is S/P Left Procedure(s): LEFT CAROTID ENDARTERECTOMY  WITH PATCH ANGIOPLASTY She was hypotensive in the PACU.  She received 2 IV boluses of 500 cc and then was started on Dopamine.  She had vomiting and a rash on her chest.  The dopamine was stopped and she received a third bolus of 500 cc.  Her BP was systolic in the 80's at this point.  She was then started on neo synephrine and was stabilized.  POD#1 she was stable weaned off neo synephrine.  Taking PO's well, ambulating and voided.   Consults:     Significant Diagnostic Studies: CBC Lab Results  Component Value Date   WBC 10.6* 08/04/2014   HGB 11.3* 08/04/2014   HCT 33.9* 08/04/2014   MCV 88.1  08/04/2014   PLT 239 08/04/2014    BMET    Component Value Date/Time   NA 138 08/04/2014 0455   K 4.3 08/04/2014 0455   CL 104 08/04/2014 0455   CO2 23 08/04/2014 0455   GLUCOSE 97 08/04/2014 0455   BUN 6 08/04/2014 0455   CREATININE 0.67 08/04/2014 0455   CREATININE 0.70 07/16/2014 1336   CALCIUM 8.5 08/04/2014 0455   GFRNONAA >90 08/04/2014 0455   GFRNONAA >89 07/16/2014 1336   GFRAA >90 08/04/2014 0455   GFRAA >89 07/16/2014 1336   COAG Lab Results  Component Value Date   INR 1.02 07/25/2014   INR 1.04 07/17/2014     Disposition:  Discharge to :Home    Medication List         aspirin 81 MG EC tablet  Take 1 tablet (81 mg total) by mouth daily.     atorvastatin 80 MG tablet  Commonly known as:  LIPITOR  Take 1 tablet (80 mg total) by mouth daily at 6 PM.     clopidogrel 75 MG tablet  Commonly known as:  PLAVIX  Take 1 tablet (75 mg total) by mouth daily.     hydrochlorothiazide 12.5 MG capsule  Commonly known as:  MICROZIDE  Take 1 capsule (12.5 mg total) by mouth daily.     ibuprofen 200 MG tablet  Commonly known as:  ADVIL,MOTRIN  Take 200 mg by mouth  every 12 (twelve) hours as needed for moderate pain.     lisinopril 10 MG tablet  Commonly known as:  PRINIVIL,ZESTRIL  Take 1 tablet (10 mg total) by mouth daily.     loratadine 10 MG tablet  Commonly known as:  CLARITIN  Take 10 mg by mouth daily as needed for allergies.       Verbal and written Discharge instructions given to the patient. Wound care per Discharge AVS     Follow-up Information   Follow up with Myra Gianotti IV, Lala Lund, MD In 2 weeks. (sent message to office)    Specialty:  Vascular Surgery   Contact information:   [address] [place] [address] [phone]       Signed: Clinton Gallant [facility] 08/07/2014, 9:32 AM  --- For VQI Registry use --- Instructions: Press F2 to tab through selections.  Delete question if not applicable.   Modified Rankin score at D/C (0-6): Rankin Score=0  IV  medication needed for:  1. Hypertension: No 2. Hypotension: Yes  Post-op Complications: No  1. Post-op CVA or TIA: No  If yes: Event classification (right eye, left eye, right cortical, left cortical, verterobasilar, other):   If yes: Timing of event (intra-op, <6 hrs post-op, >=6 hrs post-op, unknown):   2. CN injury: No  If yes: CN  injuried   3. Myocardial infarction: No  If yes: Dx by (EKG or clinical, Troponin):   4.  CHF: No  5.  Dysrhythmia (new): No  6. Wound infection: No  7. Reperfusion symptoms: No  8. Return to OR: No  If yes: return to OR for (bleeding, neurologic, other CEA incision, other):   Discharge medications: Statin use:  Yes ASA use:  Yes Beta blocker use:  No  for medical reason   ACE-Inhibitor use:  Yes P2Y12 Antagonist use:  None, [ x] Plavix,  Plasugrel,  Ticlopinine,  Ticagrelor,  Other,  No for medical reason,  Non-compliant,  Not-indicated Anti-coagulant use:  [ x] None,  Warfarin,  Rivaroxaban,  Dabigatran,  Other,  No for medical reason,  Non-compliant,  Not-indicated

## 2014-08-07 NOTE — Telephone Encounter (Signed)
Referral has been completed by Dr. Conley Rolls 08/06/2014

## 2014-08-09 NOTE — Discharge Summary (Signed)
I agree with the above  Wells Brabham 

## 2014-08-13 ENCOUNTER — Ambulatory Visit: Payer: 59 | Attending: Family Medicine

## 2014-08-13 DIAGNOSIS — R4701 Aphasia: Secondary | ICD-10-CM | POA: Insufficient documentation

## 2014-08-13 DIAGNOSIS — IMO0001 Reserved for inherently not codable concepts without codable children: Secondary | ICD-10-CM | POA: Diagnosis not present

## 2014-08-22 ENCOUNTER — Ambulatory Visit: Payer: 59 | Attending: Family Medicine

## 2014-08-22 DIAGNOSIS — IMO0001 Reserved for inherently not codable concepts without codable children: Secondary | ICD-10-CM | POA: Insufficient documentation

## 2014-08-22 DIAGNOSIS — R4701 Aphasia: Secondary | ICD-10-CM | POA: Insufficient documentation

## 2014-08-24 ENCOUNTER — Encounter: Payer: Self-pay | Admitting: Surgery

## 2014-08-27 ENCOUNTER — Ambulatory Visit (INDEPENDENT_AMBULATORY_CARE_PROVIDER_SITE_OTHER): Payer: Self-pay | Admitting: Surgery

## 2014-08-27 ENCOUNTER — Encounter: Payer: Self-pay | Admitting: Surgery

## 2014-08-27 VITALS — BP 151/63 | HR 57 | Temp 98.0°F | Ht 60.0 in | Wt 154.3 lb

## 2014-08-27 DIAGNOSIS — I6529 Occlusion and stenosis of unspecified carotid artery: Secondary | ICD-10-CM

## 2014-08-27 DIAGNOSIS — I63239 Cerebral infarction due to unspecified occlusion or stenosis of unspecified carotid arteries: Secondary | ICD-10-CM

## 2014-08-27 NOTE — Addendum Note (Signed)
Addended by: Sharee Pimple on: 08/27/2014 02:24 PM   Modules accepted: Orders

## 2014-08-27 NOTE — Progress Notes (Signed)
The patient is here for her first postoperative followup.  On 08/06/2014 she underwent left carotid endarterectomy with patch angioplasty secondary to a stroke which left her with expressive a fascia.  Intraoperative findings included a large ulceration at the carotid bifurcation.  There was approximately 80% stenosis.  The patient's postoperative course was uncomplicated and she was discharged to home.  She is here today for followup without complaints.  She does tell me that she will occasionally have some visual disturbances.  On examination she continues to have expressive aphasia which is dramatically improved since her initial presentation.  Her strength and sensation are equal bilaterally.  Her incision is healing nicely.  Because of the patient's report of visual disturbances, I feel this needs to be further evaluated.  I am unsure if this is sequelae from her original stroke or ongoing TIA from intracranial disease vs. a small intracranial aneurysm.  I'm referring her back to the stroke service to be seen within the next one to 2 weeks.  I have also scheduled her to see me in 6 months with a repeat carotid ultrasound.  I did review the patient's pathology.  I did send off a nevus which came back as benign.

## 2014-08-28 ENCOUNTER — Ambulatory Visit: Payer: 59

## 2014-08-28 DIAGNOSIS — IMO0001 Reserved for inherently not codable concepts without codable children: Secondary | ICD-10-CM | POA: Diagnosis not present

## 2014-08-29 ENCOUNTER — Encounter: Payer: Self-pay | Admitting: Neurology

## 2014-09-03 ENCOUNTER — Ambulatory Visit: Payer: 59

## 2014-09-03 DIAGNOSIS — IMO0001 Reserved for inherently not codable concepts without codable children: Secondary | ICD-10-CM | POA: Diagnosis not present

## 2014-09-04 ENCOUNTER — Ambulatory Visit: Payer: 59 | Admitting: Neurology

## 2014-09-05 ENCOUNTER — Ambulatory Visit (INDEPENDENT_AMBULATORY_CARE_PROVIDER_SITE_OTHER): Payer: 59 | Admitting: Neurology

## 2014-09-05 ENCOUNTER — Encounter: Payer: Self-pay | Admitting: Neurology

## 2014-09-05 ENCOUNTER — Ambulatory Visit: Payer: 59

## 2014-09-05 VITALS — BP 145/79 | HR 54 | Ht <= 58 in | Wt 157.2 lb

## 2014-09-05 DIAGNOSIS — I10 Essential (primary) hypertension: Secondary | ICD-10-CM

## 2014-09-05 DIAGNOSIS — IMO0001 Reserved for inherently not codable concepts without codable children: Secondary | ICD-10-CM | POA: Diagnosis not present

## 2014-09-05 DIAGNOSIS — I6350 Cerebral infarction due to unspecified occlusion or stenosis of unspecified cerebral artery: Secondary | ICD-10-CM

## 2014-09-05 DIAGNOSIS — I6529 Occlusion and stenosis of unspecified carotid artery: Secondary | ICD-10-CM | POA: Insufficient documentation

## 2014-09-05 DIAGNOSIS — I6522 Occlusion and stenosis of left carotid artery: Secondary | ICD-10-CM

## 2014-09-05 DIAGNOSIS — I671 Cerebral aneurysm, nonruptured: Secondary | ICD-10-CM

## 2014-09-05 DIAGNOSIS — E785 Hyperlipidemia, unspecified: Secondary | ICD-10-CM

## 2014-09-05 DIAGNOSIS — I639 Cerebral infarction, unspecified: Secondary | ICD-10-CM

## 2014-09-05 MED ORDER — VALSARTAN 80 MG PO TABS: 80.0000 mg | ORAL_TABLET | Freq: Every day | ORAL | Status: DC

## 2014-09-05 MED ORDER — AMLODIPINE BESYLATE 10 MG PO TABS: 10.0000 mg | ORAL_TABLET | Freq: Every day | ORAL | Status: DC

## 2014-09-05 NOTE — Patient Instructions (Addendum)
-   change BP medications due to possible side effects, stop taking HCTZ and lisinopril - initiate BP meds called amlodipine and diovan for BP control - need to follow up PCP closely for stroke risk factor modification, adjust BP meds - continue to check BP at home. - continue ASA and lipitor for stroke prevention - continue the speech therapy - follow up in 2 months.

## 2014-09-05 NOTE — Progress Notes (Signed)
STROKE NEUROLOGY FOLLOW UP NOTE  NAME: Diana Wells DOB: 10/17/1951  REASON FOR VISIT: stroke follow up HISTORY FROM: pt and chart  Today we had the pleasure of seeing Diana Wells in follow-up at our Neurology Clinic. Pt was accompanied by daughter.   History Summary Diana Wells is an 63 y.o. female, right handed, with a past medical history significant for HTN, smoking, and anxiety, was admitted on 07/17/2014 for language impairment, and confusion. CT brain revealed hypodensities within the left basal ganglia and within the left occipital lobe. 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 CTA neck and carotid doppler showed left ICA highly ulcerated plaque with 70-80% stenosis. Vascular surgery consulted and CEA planned before discharge home. She was put on ASA and plavix. 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 Conchita Paris as outpt. She was discharged in stable condition but still has expressive aphasia.  Interval History During the interval time, the patient has been doing better. She still has partial expressive aphasia and word finding difficulties but slightly better than when she was discharged. She continued to have speech therapy twice a week, and she thought it helps her speech. However her Plavix was stopped, and the patient does not know who stopped it. She is on aspirin and Lipitor for stroke prevention now.   She followed up with Dr. Conchita Paris and decided no surgery needed. She also followed up with Dr. Javier Docker and had left CEA on 08/06/14 without complications. Wound healed well.   She reported that she has intermittent blurry vision at right eye. She does have b/l visual decline and not see her eye doctor for eye check for a while and glasses is out of date, but she found that she started to have right eye blurry vision since stroke. The blurry vision just seems look through a glass on the right eye, not loss of vision,  usually in the morning shortly after taking HCTZ, could lasting 15-43min to hours or a day.   She also complains of cough since discharge and started taking lisinopril, dry cough, no sputum and sometimes cough causing nausea and vomiting.   REVIEW OF SYSTEMS: Full 14 system review of systems performed and notable only for those listed below and in HPI above, all others are negative:  Constitutional: Fatigue  Cardiovascular: N/A  Ear/Nose/Throat: N/A  Skin: N/A  Eyes: Blurry vision  Respiratory: N/A  Gastroitestinal: N/A  Genitourinary: N/A Hematology/Lymphatic: N/A  Endocrine: N/A  Musculoskeletal: N/A  Allergy/Immunology: Allergies  Neurological: Anxiety  Psychiatric: N/A  The following represents the patient's updated allergies and side effects list: Allergies  Allergen Reactions  . Codeine Other (See Comments)    Makes patient incoherent and involuntarily perform acts that she does not remember.   . Erythromycin Itching and Rash    Hallucinations  . Penicillins Itching and Rash    Hallucinations   . Lisinopril Cough  . Dopamine Hcl Nausea Only and Rash    Labs since last visit of relevance include the following: Results for orders placed during the hospital encounter of 08/03/14  CBC      Result Value Ref Range   WBC 10.6 (*) 4.0 - 10.5 K/uL   RBC 3.85 (*) 3.87 - 5.11 MIL/uL   Hemoglobin 11.3 (*) 12.0 - 15.0 g/dL   HCT 16.1 (*) 09.6 - 04.5 %   MCV 88.1  78.0 - 100.0 fL   MCH 29.4  26.0 -  34.0 pg   MCHC 33.3  30.0 - 36.0 g/dL   RDW 16.1  09.6 - 04.5 %   Platelets 239  150 - 400 K/uL  BASIC METABOLIC PANEL      Result Value Ref Range   Sodium 138  137 - 147 mEq/L   Potassium 4.3  3.7 - 5.3 mEq/L   Chloride 104  96 - 112 mEq/L   CO2 23  19 - 32 mEq/L   Glucose, Bld 97  70 - 99 mg/dL   BUN 6  6 - 23 mg/dL   Creatinine, Ser 4.09  0.50 - 1.10 mg/dL   Calcium 8.5  8.4 - 81.1 mg/dL   GFR calc non Af Amer >90  >90 mL/min   GFR calc Af Amer >90  >90 mL/min   Anion  gap 11  5 - 15  CBC      Result Value Ref Range   WBC 16.1 (*) 4.0 - 10.5 K/uL   RBC 4.10  3.87 - 5.11 MIL/uL   Hemoglobin 12.3  12.0 - 15.0 g/dL   HCT 91.4 (*) 78.2 - 95.6 %   MCV 87.6  78.0 - 100.0 fL   MCH 30.0  26.0 - 34.0 pg   MCHC 34.3  30.0 - 36.0 g/dL   RDW 21.3  08.6 - 57.8 %   Platelets 313  150 - 400 K/uL  CREATININE, SERUM      Result Value Ref Range   Creatinine, Ser 0.65  0.50 - 1.10 mg/dL   GFR calc non Af Amer >90  >90 mL/min   GFR calc Af Amer >90  >90 mL/min    The neurologically relevant items on the patient's problem list were reviewed on today's visit.  Neurologic Examination  A problem focused neurological exam (12 or more points of the single system neurologic examination, vital signs counts as 1 point, cranial nerves count for 8 points) was performed.  Blood pressure 145/79, pulse 54, height 4' (1.219 m), weight 157 lb 3.2 oz (71.305 kg).  General - Well nourished, well developed, in no apparent distress.  Ophthalmologic - Sharp disc margins OU.  Cardiovascular - Regular rate and rhythm with no murmur.  Mental Status -  Level of arousal and orientation to time, place, and person were intact. Language comprehension without problem, however she still has partial expressive aphasia, mild deficit on Naming and Repetition.  Cranial Nerves II - XII - II - Visual field intact OU. III, IV, VI - Extraocular movements intact. V - Facial sensation intact bilaterally. VII - Facial movement intact bilaterally. VIII - Hearing & vestibular intact bilaterally. X - Palate elevates symmetrically. XI - Chin turning & shoulder shrug intact bilaterally. XII - Tongue protrusion intact.  Motor Strength - The patient's strength was normal in all extremities and pronator drift was absent.  Bulk was normal and fasciculations were absent.   Motor Tone - Muscle tone was assessed at the neck and appendages and was normal.  Reflexes - The patient's reflexes were normal in  all extremities and she had no pathological reflexes.  Sensory - Light touch, temperature/pinprick and Romberg testing were assessed and were normal.    Coordination - The patient had normal movements in the hands and feet with no ataxia or dysmetria.  Tremor was absent.  Gait and Station - The patient's transfers, posture, gait, station, and turns were observed as normal.  Data reviewed: I personally reviewed the images and agree with the radiology interpretations.  CT  of the brain 07/17/2014 Hypodensities within the left basal ganglia and within the left occipital lobe which can be seen with age-indeterminate/likely subacute infarcts. There is mild expansion of the left basal ganglia with associated mass effect. As these represent different vascular distributions (MCA and PCA) recommend further evaluation with MRI/MRA.  MRI of the brain 07/18/2014 1. Multi focal acute ischemic left MCA territory ischemic infarcts involving the left basal ganglia and cortical and subcortical white matter of the left parietal and temporal lobes. No significant mass effect or evidence of hemorrhagic conversion. 2. Moderate chronic small vessel ischemic changes involving the supratentorial white matter and pons.  MRA of the brain 07/18/2014 1. 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 2. 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 3. Moderate multi focal atherosclerotic irregularity within the cavernous segments of the internal carotid arteries bilaterally and left vertebral artery.  CT Angio Neck 07/18/2014 1. Complex highly ulcerated plaque at the left carotid bifurcation. Short-segment proximal left ICA stenosis of up to 70 -75%. See sagittal image 134 of series 404. 2. No other hemodynamically significant stenosis in the neck. Great vessel origin and right carotid bifurcation plaque. 3. Dominant  dolichoectatic left vertebral artery. 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 4. Non dominant right vertebral artery with soft and calcified plaque intracranially resulting in high-grade stenosis just proximal to the right PICA origin, and then right vertebral artery occlusion distal to the PICA.  Carotid Doppler Bilateral: Vertebral artery flow is antegrade. Right = 1-39% ICA stenosis. Left = 60-79% ICA stenosis.  2D Echocardiogram EF 55-60% with no source of embolus.  CXR 07/18/2014 No edema or consolidation. Atherosclerotic change with areas of calcification in the aorta and left carotid artery.  EKG normal sinus rhythm, nonspecific ST and T waves changes  Assessment: As you may recall, she is a 63 y.o. Caucasian female with PMH of HTN, smoking, and anxiety, was admitted on 07/17/2014 for left MCA stroke, found to have left ICA stenosis 80%. Put on ASA and plavix as well as lipitor. Had LICA CEA by Dr. Myra Gianotti on 08/06/14. Also followed with Dr. Conchita Paris for left VA fusiform aneurysm which does not need intervention at this time. She is on aspirin and Lipitor now. She complained of transient blurry vision urine up to taking HCTZ, and also dry cough which likely to be side effect from lisinopril. Will change blood pressure medications this time. She needle followup with PCP for further blood pressure medication adjustment.  Plan:  - discontinue HCTZ and lisinopril, start on amlodipine and diovan - Followup with PCP closely for blood pressure medication adjustment and a stroke risk factor modification - Continue aspirin and Lipitor for stroke prevention - Check blood pressure at home - Continue to work closely with the speech therapist for treatment of aphasia - RTC in 2 months  Meds ordered this encounter  Medications  . amLODipine (NORVASC) 10 MG tablet    Sig: Take 1 tablet (10 mg total) by mouth daily.    Dispense:  90 tablet    Refill:  3  .  valsartan (DIOVAN) 80 MG tablet    Sig: Take 1 tablet (80 mg total) by mouth daily.    Dispense:  90 tablet    Refill:  3     Patient Instructions  - change BP medications due to possible side effects,  stop taking HCTZ and lisinopril - initiate BP meds called amlodipine and diovan for BP control - need to follow up PCP closely for stroke risk factor modification, adjust BP meds - continue to check BP at home. - continue ASA and lipitor for stroke prevention - continue the speech therapy - follow up in 2 months.     Marvel Plan, MD PhD [facility] [address] [place], [place] [address] [phone]

## 2014-09-10 ENCOUNTER — Ambulatory Visit: Payer: 59

## 2014-09-10 DIAGNOSIS — IMO0001 Reserved for inherently not codable concepts without codable children: Secondary | ICD-10-CM | POA: Diagnosis not present

## 2014-09-19 ENCOUNTER — Ambulatory Visit: Payer: 59 | Attending: Family Medicine

## 2014-09-24 ENCOUNTER — Ambulatory Visit: Payer: 59

## 2014-10-16 ENCOUNTER — Other Ambulatory Visit: Payer: Self-pay | Admitting: Family Medicine

## 2014-10-17 NOTE — Telephone Encounter (Signed)
Dr Conley RollsLe, pt has appt w/you 10/26/14. You saw her to est care but haven't Rxd this yet. Do you want to OK RF?

## 2014-10-19 ENCOUNTER — Telehealth: Payer: Self-pay

## 2014-10-19 DIAGNOSIS — I639 Cerebral infarction, unspecified: Secondary | ICD-10-CM

## 2014-10-19 NOTE — Telephone Encounter (Signed)
Patient called states she was seen at [facility] due to a stroke.patient states she is getting bills from them because she needs a referral from her PCP. Per pt dr Conley Rollsle is her PCP and patient  states her insurance is requiring a referral to [facility]. Please call to advise if we can do a retro referral

## 2014-10-23 NOTE — Telephone Encounter (Signed)
PLease refer her, I am not sure who [facility] is?  I referred her to [facility].

## 2014-10-23 NOTE — Addendum Note (Signed)
Addended by: Elease EtienneHANSEN, Oswin Griffith A on: 10/23/2014 04:02 PM   Modules accepted: Orders

## 2014-10-26 ENCOUNTER — Encounter: Payer: Self-pay | Admitting: Family Medicine

## 2014-10-26 ENCOUNTER — Ambulatory Visit (INDEPENDENT_AMBULATORY_CARE_PROVIDER_SITE_OTHER): Payer: 59 | Admitting: Family Medicine

## 2014-10-26 VITALS — BP 130/62 | HR 77 | Temp 98.0°F | Resp 16 | Ht 60.5 in | Wt 153.4 lb

## 2014-10-26 DIAGNOSIS — H539 Unspecified visual disturbance: Secondary | ICD-10-CM

## 2014-10-26 DIAGNOSIS — E785 Hyperlipidemia, unspecified: Secondary | ICD-10-CM

## 2014-10-26 DIAGNOSIS — I63419 Cerebral infarction due to embolism of unspecified middle cerebral artery: Secondary | ICD-10-CM

## 2014-10-26 DIAGNOSIS — I10 Essential (primary) hypertension: Secondary | ICD-10-CM

## 2014-10-26 NOTE — Progress Notes (Signed)
Chief Complaint:  Chief Complaint  Patient presents with  . Medication Refill    Atenolol 50 mg    HPI: Diana Wells is a 63 y.o. female who is here for  BP medication refill of her Atenolol. She is actually  Not supposed to be on atenolol. I told her this the last time she was here in 07/2014 after she had her stroke. She is currently supposed to be only on amlodipine, lipitor, diovan and asa 81 mg. She was on plavix but is no longer taking it, per patient she does not know who stopped it. When she last saw neurology in 09/05/14 they had a similar question but did not restart her on plavix. She had  some blurred vision aftet taking the HCTZ and also a dry cough after taking  Lisinopril and was taken off those by her neurologist. I believe she was still taking the atenolol eventhough on our last visit I told her to stop the atenolol since it was stopped when she got out of the hospital. She has some floaters. She feels intermittently dizzy with changes in position but again still taking atenolol. No longer having dry cough. Please see her hx below. Otherwise she is doing well with he expressive aphasia after her stroke, she has stopped smoking. She is doing much better with her aphasia, she states she only gets flustered and can;t express herself or has difficulty with word finding if she is nervous or not comfortable.    OV from neurology on 09/05/14: History Summary Diana Wells is an 63 y.o. female, right handed, with a past medical history significant for HTN, smoking, and anxiety, was admitted on 07/17/2014 for language impairment, and confusion. CT brain revealed hypodensities within the left basal ganglia and within the left occipital lobe. 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 CTA neck and carotid doppler showed left ICA highly ulcerated plaque with 70-80% stenosis. Vascular surgery consulted and CEA planned before discharge home. She was put on ASA and plavix. She was also found  to have left VA fusiform aneurysm and she is scheduled to follow up with Dr. Conchita ParisNundkumar as outpt. She was discharged in stable condition but still has expressive aphasia.  Interval History During the interval time, the patient has been doing better. She still has partial expressive aphasia and word finding difficulties but slightly better than when she was discharged. She continued to have speech therapy twice a week, and she thought it helps her speech. However her Plavix was stopped, and the patient does not know who stopped it. She is on aspirin and Lipitor for stroke prevention now.   She followed up with Dr. Conchita ParisNundkumar and decided no surgery needed. She also followed up with Dr. Javier DockerBrabhma and had left CEA on 08/06/14 without complications. Wound healed well.   She reported that she has intermittent blurry vision at right eye. She does have b/l visual decline and not see her eye doctor for eye check for a while and glasses is out of date, but she found that she started to have right eye blurry vision since stroke. The blurry vision just seems look through a glass on the right eye, not loss of vision, usually in the morning shortly after taking HCTZ, could lasting 15-3420min to hours or a day.   She also complains of cough since discharge and started taking lisinopril, dry cough, no sputum and sometimes cough causing nausea and vomiting.  Past Medical History  Diagnosis Date  .  Anxiety   . Hypertension   . Stroke     8/15 earlier in month  . Aphasia     slight aphasia  . GERD (gastroesophageal reflux disease)   . Peripheral vascular disease     ? in arm lft  . Vertebral artery aneurysm     left VA aneursym by 07/2014 MRA (further eval by Dr. Conchita Paris following L CEA surgery)   Past Surgical History  Procedure Laterality Date  . Endarterectomy Left 08/03/2014    Procedure: LEFT CAROTID ENDARTERECTOMY  WITH PATCH ANGIOPLASTY;  Surgeon: Nada Libman, MD;  Location: [facility] OR;  Service: Vascular;   Laterality: Left;   History   Social History  . Marital Status: Divorced    Spouse Name: N/A    Number of Children: 1  . Years of Education: college   Occupational History  . retired    Social History Main Topics  . Smoking status: Former Smoker -- 0.25 packs/day for 47 years    Types: Cigarettes    Quit date: 07/19/2014  . Smokeless tobacco: None  . Alcohol Use: No  . Drug Use: No  . Sexual Activity: None   Other Topics Concern  . None   Social History Narrative   Family History  Problem Relation Age of Onset  . Heart failure Mother   . Hypertension Maternal Grandfather   . Heart attack Neg Hx    Allergies  Allergen Reactions  . Codeine Other (See Comments)    Makes patient incoherent and involuntarily perform acts that she does not remember.   . Erythromycin Itching and Rash    Hallucinations  . Penicillins Itching and Rash    Hallucinations   . Lisinopril Cough  . Dopamine Hcl Nausea Only and Rash   Prior to Admission medications   Medication Sig Start Date End Date Taking? Authorizing Provider  amLODipine (NORVASC) 10 MG tablet Take 1 tablet (10 mg total) by mouth daily. 09/05/14  Yes Marvel Plan, MD  aspirin EC 81 MG EC tablet Take 1 tablet (81 mg total) by mouth daily. 07/19/14  Yes Joanna Puff, MD  atorvastatin (LIPITOR) 80 MG tablet TAKE 1 TABLET BY MOUTH DAILY AT 6:00PM 10/17/14  Yes Julene Rahn P Leaann Nevils, DO  ibuprofen (ADVIL,MOTRIN) 200 MG tablet Take 200 mg by mouth every 12 (twelve) hours as needed for moderate pain.    Yes Historical Provider, MD  loratadine (CLARITIN) 10 MG tablet Take 10 mg by mouth daily as needed for allergies.   Yes Historical Provider, MD  PRESCRIPTION MEDICATION Atenolol 50 mg taking  1 tablet daily   Yes Historical Provider, MD  valsartan (DIOVAN) 80 MG tablet Take 1 tablet (80 mg total) by mouth daily. 09/05/14  Yes Marvel Plan, MD     ROS: The patient denies fevers, chills, night sweats, unintentional weight loss, chest pain,  palpitations, wheezing, dyspnea on exertion, nausea, vomiting, abdominal pain, dysuria, hematuria, melena, numbness, weakness, or tingling.   All other systems have been reviewed and were otherwise negative with the exception of those mentioned in the HPI and as above.    PHYSICAL EXAM: Filed Vitals:   10/26/14 0927  BP: 130/62  Pulse: 77  Temp: 98 F (36.7 C)  Resp: 16   Filed Vitals:   10/26/14 0927  Height: 5' 0.5" (1.537 m)  Weight: 153 lb 6.4 oz (69.582 kg)   Body mass index is 29.45 kg/(m^2).  General: Alert, no acute distress HEENT:  Normocephalic, atraumatic, oropharynx patent. EOMI, PERRLA,  left CEA wound healing well Cardiovascular:  Regular rate and rhythm, no rubs murmurs or gallops.  Radial pulse intact. No pedal edema.  Respiratory: Clear to auscultation bilaterally.  No wheezes, rales, or rhonchi.  No cyanosis, no use of accessory musculature GI: No organomegaly, abdomen is soft and non-tender, positive bowel sounds.  No masses. Skin: No rashes. Neurologic: Facial musculature symmetric. She still has some mild expressive aphsai but much improved, 5/5 strength UE and Kawhi Diebold Psychiatric: Patient is appropriate throughout our interaction. Lymphatic: No cervical lymphadenopathy Musculoskeletal: Gait intact.   LABS: Results for orders placed or performed during the hospital encounter of 08/03/14  CBC  Result Value Ref Range   WBC 10.6 (H) 4.0 - 10.5 K/uL   RBC 3.85 (L) 3.87 - 5.11 MIL/uL   Hemoglobin 11.3 (L) 12.0 - 15.0 g/dL   HCT 16.133.9 (L) 09.636.0 - 04.546.0 %   MCV 88.1 78.0 - 100.0 fL   MCH 29.4 26.0 - 34.0 pg   MCHC 33.3 30.0 - 36.0 g/dL   RDW 40.913.3 81.111.5 - 91.415.5 %   Platelets 239 150 - 400 K/uL  Basic metabolic panel  Result Value Ref Range   Sodium 138 137 - 147 mEq/L   Potassium 4.3 3.7 - 5.3 mEq/L   Chloride 104 96 - 112 mEq/L   CO2 23 19 - 32 mEq/L   Glucose, Bld 97 70 - 99 mg/dL   BUN 6 6 - 23 mg/dL   Creatinine, Ser 7.820.67 0.50 - 1.10 mg/dL   Calcium 8.5 8.4  - 95.610.5 mg/dL   GFR calc non Af Amer >90 >90 mL/min   GFR calc Af Amer >90 >90 mL/min   Anion gap 11 5 - 15  CBC  Result Value Ref Range   WBC 16.1 (H) 4.0 - 10.5 K/uL   RBC 4.10 3.87 - 5.11 MIL/uL   Hemoglobin 12.3 12.0 - 15.0 g/dL   HCT 21.335.9 (L) 08.636.0 - 57.846.0 %   MCV 87.6 78.0 - 100.0 fL   MCH 30.0 26.0 - 34.0 pg   MCHC 34.3 30.0 - 36.0 g/dL   RDW 46.913.1 62.911.5 - 52.815.5 %   Platelets 313 150 - 400 K/uL  Creatinine, serum  Result Value Ref Range   Creatinine, Ser 0.65 0.50 - 1.10 mg/dL   GFR calc non Af Amer >90 >90 mL/min   GFR calc Af Amer >90 >90 mL/min     EKG/XRAY:   Primary [facility]   ASSESSMENT/PLAN: Encounter Diagnoses  Name Primary?  . Essential hypertension Yes  . Hyperlipidemia   . 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 Visual disturbance     63 y/o female with a PMH of left  ischemic stroke , left cea, vca aneurysm, htn, hyperlipdemia, expressive aphasia who is here for refills of her htn med Advise to stop atenolo, cont with norvasc and diovan, call with bp readings in 1 week Refer tp optho for floaters Recent labs so will defer Is she supposed to be on plavix, will check with both vascular and also neurology F/u prn otherwise in 6 months.   Gross sideeffects, risk and benefits, and alternatives of medications d/w patient. Patient is aware that all medications have potential sideeffects and we are unable to predict every sideeffect or drug-drug interaction that may occur.  Emry Tobin PHUONG, DO 10/26/2014 10:11 AM

## 2014-10-30 ENCOUNTER — Telehealth: Payer: Self-pay

## 2014-10-30 NOTE — Telephone Encounter (Signed)
Pt calling to update you on her BP readings. It has gradually increased and is now 145/85. Please advise. Thanks

## 2014-10-31 NOTE — Telephone Encounter (Signed)
Spoke with pateint her BP has been in the high 140s-150s/80-90s, will increase Valsartan to BID , continue with norvasc 10 mg daily She will monitor her BP  , dizziness, CP et. Currently no sxs.

## 2014-11-02 ENCOUNTER — Telehealth: Payer: Self-pay | Admitting: Family Medicine

## 2014-11-02 NOTE — Telephone Encounter (Signed)
She does not need to take plavix, only need to be on low dose aspirin 81 only, bp 130/70-138/78, has optho appt next tuesday

## 2014-11-15 ENCOUNTER — Encounter: Payer: Self-pay | Admitting: Neurology

## 2014-11-15 ENCOUNTER — Ambulatory Visit (INDEPENDENT_AMBULATORY_CARE_PROVIDER_SITE_OTHER): Payer: 59 | Admitting: Neurology

## 2014-11-15 VITALS — BP 149/80 | HR 83 | Ht 60.5 in | Wt 156.0 lb

## 2014-11-15 DIAGNOSIS — I639 Cerebral infarction, unspecified: Secondary | ICD-10-CM

## 2014-11-15 DIAGNOSIS — I6350 Cerebral infarction due to unspecified occlusion or stenosis of unspecified cerebral artery: Secondary | ICD-10-CM

## 2014-11-15 DIAGNOSIS — E785 Hyperlipidemia, unspecified: Secondary | ICD-10-CM

## 2014-11-15 DIAGNOSIS — I6522 Occlusion and stenosis of left carotid artery: Secondary | ICD-10-CM

## 2014-11-15 DIAGNOSIS — I671 Cerebral aneurysm, nonruptured: Secondary | ICD-10-CM

## 2014-11-15 DIAGNOSIS — I10 Essential (primary) hypertension: Secondary | ICD-10-CM

## 2014-11-15 NOTE — Patient Instructions (Addendum)
-   continue ASA and lipitor for stroke prevention - check BP at home - compliant with medication - Follow up with your primary care physician for stroke risk factor modification. Recommend maintain blood pressure goal <130/80, diabetes with hemoglobin A1c goal below 6.5% and lipids with LDL cholesterol goal below 70 mg/dL.  - continue speech exercise at home. - if your anxiety or agitation getting worse, let us know and we may consider medication. - follow up in 4 months.

## 2014-11-15 NOTE — Progress Notes (Signed)
STROKE NEUROLOGY FOLLOW UP NOTE  NAME: Diana Wells DOB: 1951/05/12  REASON FOR VISIT: stroke follow up HISTORY FROM: pt and chart  Today we had the pleasure of seeing Diana Wells in follow-up at our Neurology Clinic. Pt was accompanied by no one.   History Summary Diana Wells is an 63 y.o. female, right handed, with a past medical history significant for HTN, smoking, and anxiety, was admitted on 07/17/2014 for language impairment, and confusion. CT brain revealed hypodensities within the left basal ganglia and within the left occipital lobe. 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 CTA neck and carotid doppler showed left ICA highly ulcerated plaque with 70-80% stenosis. Vascular surgery consulted and CEA planned before discharge home. She was put on ASA and plavix. 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 Conchita Paris as outpt. She was discharged in stable condition but still has expressive aphasia.  Follow up 08/16/14 - the patient has been doing better. She still has partial expressive aphasia and word finding difficulties but slightly better than when she was discharged. She continued to have speech therapy twice a week, and she thought it helps her speech. However her Plavix was stopped, and the patient does not know who stopped it. She is on aspirin and Lipitor for stroke prevention now.  She followed up with Dr. Conchita Paris and decided no surgery needed. She also followed up with Dr. Javier Docker and had left CEA on 08/06/14 without complications. Wound healed well.  She reported that she has intermittent blurry vision at right eye. She does have b/l visual decline and not see her eye doctor for eye check for a while and glasses is out of date, but she found that she started to have right eye blurry vision since stroke. The blurry vision just seems look through a glass on the right eye, not loss of vision, usually in the morning shortly  after taking HCTZ, could lasting 15-36min to hours or a day.  She also complains of cough since discharge and started taking lisinopril, dry cough, no sputum and sometimes cough causing nausea and vomiting.   Interval History During the interval time, she is doing much better on speech. She is much fluent and able to get words out most of the time. She still has some paraphasic errors and hesitancy when she felt stress, anxious or on pressure. She has been discharged from speech therapy and currently doing home exercise. Her BP today 149/80 in clinic but she said she monitored her BP at home which was 120s/70s. She is compliant with medication. No more dry cough  REVIEW OF SYSTEMS: Full 14 system review of systems performed and notable only for those listed below and in HPI above, all others are negative:  Constitutional: N/A  Cardiovascular: N/A  Ear/Nose/Throat: N/A  Skin: N/A   Eyes: Blurry vision  Respiratory: N/A  Gastroitestinal: N/A  Genitourinary: N/A Hematology/Lymphatic: N/A  Endocrine: N/A  Musculoskeletal: N/A  Allergy/Immunology: N/A Neurological: Anxiety, memory loss Psychiatric: N/A  The following represents the patient's updated allergies and side effects list: Allergies  Allergen Reactions  . Codeine Other (See Comments)    Makes patient incoherent and involuntarily perform acts that she does not remember.   . Erythromycin Itching and Rash    Hallucinations  . Penicillins Itching and Rash    Hallucinations   . Eggs Or Egg-Derived Products     Per pt "severely allergic" gets really  sick  . Lisinopril Cough  . Dopamine Hcl Nausea Only and Rash    Labs since last visit of relevance include the following: Results for orders placed or performed during the hospital encounter of 08/03/14  CBC  Result Value Ref Range   WBC 10.6 (H) 4.0 - 10.5 K/uL   RBC 3.85 (L) 3.87 - 5.11 MIL/uL   Hemoglobin 11.3 (L) 12.0 - 15.0 g/dL   HCT 16.133.9 (L) 09.636.0 - 04.546.0 %   MCV 88.1 78.0  - 100.0 fL   MCH 29.4 26.0 - 34.0 pg   MCHC 33.3 30.0 - 36.0 g/dL   RDW 40.913.3 81.111.5 - 91.415.5 %   Platelets 239 150 - 400 K/uL  Basic metabolic panel  Result Value Ref Range   Sodium 138 137 - 147 mEq/L   Potassium 4.3 3.7 - 5.3 mEq/L   Chloride 104 96 - 112 mEq/L   CO2 23 19 - 32 mEq/L   Glucose, Bld 97 70 - 99 mg/dL   BUN 6 6 - 23 mg/dL   Creatinine, Ser 7.820.67 0.50 - 1.10 mg/dL   Calcium 8.5 8.4 - 95.610.5 mg/dL   GFR calc non Af Amer >90 >90 mL/min   GFR calc Af Amer >90 >90 mL/min   Anion gap 11 5 - 15  CBC  Result Value Ref Range   WBC 16.1 (H) 4.0 - 10.5 K/uL   RBC 4.10 3.87 - 5.11 MIL/uL   Hemoglobin 12.3 12.0 - 15.0 g/dL   HCT 21.335.9 (L) 08.636.0 - 57.846.0 %   MCV 87.6 78.0 - 100.0 fL   MCH 30.0 26.0 - 34.0 pg   MCHC 34.3 30.0 - 36.0 g/dL   RDW 46.913.1 62.911.5 - 52.815.5 %   Platelets 313 150 - 400 K/uL  Creatinine, serum  Result Value Ref Range   Creatinine, Ser 0.65 0.50 - 1.10 mg/dL   GFR calc non Af Amer >90 >90 mL/min   GFR calc Af Amer >90 >90 mL/min    The neurologically relevant items on the patient's problem list were reviewed on today's visit.  Neurologic Examination  A problem focused neurological exam (12 or more points of the single system neurologic examination, vital signs counts as 1 point, cranial nerves count for 8 points) was performed.  Blood pressure 149/80, pulse 83, height 5' 0.5" (1.537 m), weight 156 lb (70.761 kg).  General - Well nourished, well developed, in no apparent distress.  Ophthalmologic - Sharp disc margins OU.  Cardiovascular - Regular rate and rhythm with no murmur.  Mental Status -  Level of arousal and orientation to time, place, and person were intact. Language comprehension and naming without problem, however she still has mild expressive aphasia with paraphasic errors, very mild deficit on Repetition.  Cranial Nerves II - XII - II - Visual field intact OU. III, IV, VI - Extraocular movements intact. V - Facial sensation intact  bilaterally. VII - Facial movement intact bilaterally. VIII - Hearing & vestibular intact bilaterally. X - Palate elevates symmetrically. XI - Chin turning & shoulder shrug intact bilaterally. XII - Tongue protrusion intact.  Motor Strength - The patient's strength was normal in all extremities and pronator drift was absent.  Bulk was normal and fasciculations were absent.   Motor Tone - Muscle tone was assessed at the neck and appendages and was normal.  Reflexes - The patient's reflexes were normal in all extremities and she had no pathological reflexes.  Sensory - Light touch, temperature/pinprick and Romberg testing were  assessed and were normal.    Coordination - The patient had normal movements in the hands and feet with no ataxia or dysmetria.  Tremor was absent.  Gait and Station - The patient's transfers, posture, gait, station, and turns were observed as normal.  Data reviewed: I personally reviewed the images and agree with the radiology interpretations.  CT of the brain 07/17/2014 Hypodensities within the left basal ganglia and within the left occipital lobe which can be seen with age-indeterminate/likely subacute infarcts. There is mild expansion of the left basal ganglia with associated mass effect. As these represent different vascular distributions (MCA and PCA) recommend further evaluation with MRI/MRA.  MRI of the brain 07/18/2014 1. Multi focal acute ischemic left MCA territory ischemic infarcts involving the left basal ganglia and cortical and subcortical white matter of the left parietal and temporal lobes. No significant mass effect or evidence of hemorrhagic conversion. 2. Moderate chronic small vessel ischemic changes involving the supratentorial white matter and pons.  MRA of the brain 07/18/2014 1. 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 2. Irregular 10 x 7.8 x 6.9 mm fusiform aneurysm involving  the distal left vertebral artery as above. 3. Moderate multi focal atherosclerotic irregularity within the cavernous segments of the internal carotid arteries bilaterally and left vertebral artery.  CT Angio Neck 07/18/2014 1. Complex highly ulcerated plaque at the left carotid bifurcation. Short-segment proximal left ICA stenosis of up to 70 -75%. See sagittal image 134 of series 404. 2. No other hemodynamically significant stenosis in the neck. Great vessel origin and right carotid bifurcation plaque. 3. Dominant dolichoectatic left vertebral artery. 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 4. Non dominant right vertebral artery with soft and calcified plaque intracranially resulting in high-grade stenosis just proximal to the right PICA origin, and then right vertebral artery occlusion distal to the PICA.  Carotid Doppler Bilateral: Vertebral artery flow is antegrade. Right = 1-39% ICA stenosis. Left = 60-79% ICA stenosis.  2D Echocardiogram EF 55-60% with no source of embolus.  CXR 07/18/2014 No edema or consolidation. Atherosclerotic change with areas of calcification in the aorta and left carotid artery.  EKG normal sinus rhythm, nonspecific ST and T waves changes  Assessment: As you may recall, she is a 63 y.o. Caucasian female with PMH of HTN, smoking, and anxiety, was admitted on 07/17/2014 for left MCA stroke, found to have left ICA stenosis 80%. Put on ASA and plavix as well as lipitor. Had LICA CEA by Dr. Myra Gianotti on 08/06/14. Also followed with Dr. Conchita Paris for left VA fusiform aneurysm which does not need intervention at this time. She is on aspirin and Lipitor now. She complained of transient blurry vision after taking HCTZ, and also dry cough which likely to be side effect from lisinopril. So her HCTZ and lisinopril were stopped and changed to novasc and diovan, so far BP controlled well at home.   Plan:  - continue ASA and lipitor for stroke  prevention  - continue amlodipine and diovan for BP control and check BP at home - Follow up with your primary care physician for stroke risk factor modification. Recommend maintain blood pressure goal <130/80, diabetes with hemoglobin A1c goal below 6.5% and lipids with LDL cholesterol goal below 70 mg/dL.  - continue speech exercise at home - RTC in 4 months  No orders of the defined types were placed in this encounter.    Patient  Instructions  - continue ASA and lipitor for stroke prevention - check BP at home - compliant with medication - Follow up with your primary care physician for stroke risk factor modification. Recommend maintain blood pressure goal <130/80, diabetes with hemoglobin A1c goal below 6.5% and lipids with LDL cholesterol goal below 70 mg/dL.  - continue speech exercise at home. - if your anxiety or agitation getting worse, let us know and we may consider medication. - follow up in 4 months.    Marvel PlanJindong Zakhia Seres, MD PhD [facility] [address], [address] [place], [place] [address] [phone]

## 2015-01-13 ENCOUNTER — Other Ambulatory Visit: Payer: Self-pay | Admitting: Family Medicine

## 2015-02-22 ENCOUNTER — Encounter: Payer: Self-pay | Admitting: Surgery

## 2015-02-25 ENCOUNTER — Other Ambulatory Visit: Payer: Self-pay | Admitting: *Deleted

## 2015-02-25 ENCOUNTER — Ambulatory Visit (INDEPENDENT_AMBULATORY_CARE_PROVIDER_SITE_OTHER): Payer: 59 | Admitting: Surgery

## 2015-02-25 ENCOUNTER — Encounter: Payer: Self-pay | Admitting: Surgery

## 2015-02-25 ENCOUNTER — Ambulatory Visit (HOSPITAL_COMMUNITY)
Admission: RE | Admit: 2015-02-25 | Discharge: 2015-02-25 | Disposition: A | Discharge status: Home / Self Care | Payer: 59 | Source: Ambulatory Visit | Attending: Surgery | Admitting: Surgery

## 2015-02-25 VITALS — BP 143/70 | HR 88 | Ht 60.0 in | Wt 153.0 lb

## 2015-02-25 DIAGNOSIS — I6523 Occlusion and stenosis of bilateral carotid arteries: Secondary | ICD-10-CM

## 2015-02-25 DIAGNOSIS — I63239 Cerebral infarction due to unspecified occlusion or stenosis of unspecified carotid arteries: Secondary | ICD-10-CM

## 2015-02-25 NOTE — Progress Notes (Signed)
Patient name: Diana Wells MRN: [id number] DOB: 1951-09-03 Sex: female     Chief Complaint  Patient presents with  . Re-evaluation    6 month f/u - carotid    HISTORY OF PRESENT ILLNESS: The patient is back today for follow-up.  She is status post left carotid endarterectomy on 08/06/2014.  This was done for symptomatic left carotid stenosis.  Prior to her operation, she had suffered a left brain stroke which left her with symptoms of expressive aphasia.  She states that her symptoms have improved significantly.  Past Medical History  Diagnosis Date  . Anxiety   . Hypertension   . Stroke     8/15 earlier in month  . Aphasia     slight aphasia  . GERD (gastroesophageal reflux disease)   . Peripheral vascular disease     ? in arm lft  . Vertebral artery aneurysm     left VA aneursym by 07/2014 MRA (further eval by Dr. Conchita Paris following L CEA surgery)    Past Surgical History  Procedure Laterality Date  . Endarterectomy Left 08/03/2014    Procedure: LEFT CAROTID ENDARTERECTOMY  WITH PATCH ANGIOPLASTY;  Surgeon: Nada Libman, MD;  Location: [facility] OR;  Service: Vascular;  Laterality: Left;    History   Social History  . Marital Status: Divorced    Spouse Name: N/A  . Number of Children: 1  . Years of Education: college   Occupational History  . retired    Social History Main Topics  . Smoking status: Former Smoker -- 0.25 packs/day for 47 years    Types: Cigarettes    Quit date: 07/19/2014  . Smokeless tobacco: Never Used  . Alcohol Use: No  . Drug Use: No  . Sexual Activity: Not on file   Other Topics Concern  . Not on file   Social History Narrative   Patient is divorced with one child.   Patient is right handed.   Patient has college education.   Patient drinks 2 cups daily.    Family History  Problem Relation Age of Onset  . Heart failure Mother   . Hypertension Maternal Grandfather   . Heart attack Neg Hx     Allergies as of 02/25/2015 -  Review Complete 02/25/2015  Allergen Reaction Noted  . Codeine Other (See Comments) 07/17/2014  . Erythromycin Itching and Rash 07/17/2014  . Penicillins Itching and Rash 07/17/2014  . Eggs or egg-derived products  10/26/2014  . Lisinopril Cough 09/05/2014  . Dopamine hcl Nausea Only and Rash 08/03/2014    Current Outpatient Prescriptions on File Prior to Visit  Medication Sig Dispense Refill  . amLODipine (NORVASC) 10 MG tablet Take 1 tablet (10 mg total) by mouth daily. 90 tablet 3  . aspirin EC 81 MG EC tablet Take 1 tablet (81 mg total) by mouth daily. 30 tablet 0  . atorvastatin (LIPITOR) 80 MG tablet TAKE 1 TABLET BY MOUTH DAILY AT 6:00PM 30 tablet 5  . ibuprofen (ADVIL,MOTRIN) 200 MG tablet Take 200 mg by mouth every 12 (twelve) hours as needed for moderate pain.     Marland Kitchen loratadine (CLARITIN) 10 MG tablet Take 10 mg by mouth daily as needed for allergies.    . valsartan (DIOVAN) 80 MG tablet Take 1 tablet (80 mg total) by mouth daily. 90 tablet 3   No current facility-administered medications on file prior to visit.     REVIEW OF SYSTEMS: Please see history of present illness, otherwise negative  PHYSICAL EXAMINATION:   Vital signs are BP 143/70 mmHg  Pulse 88  Ht 5' (1.524 m)  Wt 153 lb (69.4 kg)  BMI 29.88 kg/m2  SpO2 98% General: The patient appears their stated age. HEENT:  No gross abnormalities Pulmonary:  Non labored breathing Musculoskeletal: There are no major deformities. Neurologic: Expressive aphasia Skin: There are no ulcer or rashes noted. Psychiatric: The patient has normal affect. Cardiovascular: There is a regular rate and rhythm without significant murmur appreciated.   Diagnostic Studies Carotid duplex reveals right carotid stenosis less than 40% and a widely patent left carotid endarterectomy  Assessment: Left carotid stenosis  Plan: The patient is doing very well from my perspective.  Her left carotid endarterectomy site is widely patent.   Her symptoms continued to improve.  She will follow up in one year with a surveillance ultrasound   V. Charlena CrossWells Brabham IV, M.D. [facility] [place] Office: [phone] Pager:  [phone]

## 2015-03-18 ENCOUNTER — Ambulatory Visit: Payer: 59 | Admitting: Neurology

## 2015-03-20 ENCOUNTER — Ambulatory Visit: Payer: 59 | Admitting: Neurology

## 2015-04-16 ENCOUNTER — Other Ambulatory Visit: Payer: Self-pay | Admitting: Family Medicine

## 2015-05-01 ENCOUNTER — Ambulatory Visit (INDEPENDENT_AMBULATORY_CARE_PROVIDER_SITE_OTHER): Payer: 59 | Admitting: Family Medicine

## 2015-05-01 VITALS — BP 140/80 | HR 95 | Temp 97.9°F | Resp 16 | Ht 60.0 in | Wt 151.6 lb

## 2015-05-01 DIAGNOSIS — R4701 Aphasia: Secondary | ICD-10-CM | POA: Diagnosis not present

## 2015-05-01 DIAGNOSIS — I10 Essential (primary) hypertension: Secondary | ICD-10-CM

## 2015-05-01 DIAGNOSIS — E785 Hyperlipidemia, unspecified: Secondary | ICD-10-CM | POA: Diagnosis not present

## 2015-05-01 DIAGNOSIS — I6930 Unspecified sequelae of cerebral infarction: Secondary | ICD-10-CM

## 2015-05-01 DIAGNOSIS — I63419 Cerebral infarction due to embolism of unspecified middle cerebral artery: Secondary | ICD-10-CM | POA: Diagnosis not present

## 2015-05-01 LAB — COMPLETE METABOLIC PANEL WITHOUT GFR
Albumin: 4.8 g/dL (ref 3.5–5.2)
Alkaline Phosphatase: 132 U/L — ABNORMAL HIGH (ref 39–117)
BUN: 9 mg/dL (ref 6–23)
Calcium: 9.7 mg/dL (ref 8.4–10.5)
GFR, Est African American: >89 mL/min
GFR, Est Non African American: >89 mL/min
Sodium: 137 meq/L (ref 135–145)

## 2015-05-01 LAB — COMPLETE METABOLIC PANEL WITH GFR
ALT: 62 U/L — ABNORMAL HIGH (ref 0–35)
AST: 47 U/L — ABNORMAL HIGH (ref 0–37)
CO2: 23 mEq/L (ref 19–32)
Chloride: 103 mEq/L (ref 96–112)
Creat: 0.61 mg/dL (ref 0.50–1.10)
Glucose, Bld: 98 mg/dL (ref 70–99)
Potassium: 4.1 mEq/L (ref 3.5–5.3)
Total Bilirubin: 0.6 mg/dL (ref 0.2–1.2)
Total Protein: 7.8 g/dL (ref 6.0–8.3)

## 2015-05-01 LAB — LIPID PANEL
Cholesterol: 150 mg/dL (ref 0–200)
HDL: 36 mg/dL — ABNORMAL LOW (ref >=46)
LDL Cholesterol: 90 mg/dL (ref 0–99)
Total CHOL/HDL Ratio: 4.2 Ratio
Triglycerides: 120 mg/dL (ref <150)
VLDL: 24 mg/dL (ref 0–40)

## 2015-05-01 LAB — TSH: TSH: 0.621 u[IU]/mL (ref 0.350–4.500)

## 2015-05-01 MED ORDER — VALSARTAN 80 MG PO TABS
80.0000 mg | ORAL_TABLET | Freq: Every day | ORAL | Status: DC
Start: 2015-05-01 — End: 2015-12-27

## 2015-05-01 MED ORDER — AMLODIPINE BESYLATE 10 MG PO TABS: 10.0000 mg | ORAL_TABLET | Freq: Every day | ORAL | Status: DC

## 2015-05-01 MED ORDER — ATORVASTATIN CALCIUM 80 MG PO TABS: ORAL_TABLET | ORAL | Status: DC

## 2015-05-01 MED ORDER — VALSARTAN 80 MG PO TABS
80.0000 mg | ORAL_TABLET | Freq: Every day | ORAL | Status: DC
Start: 2015-05-01 — End: 2015-05-01

## 2015-05-01 NOTE — Progress Notes (Signed)
Chief Complaint:  Chief Complaint  Patient presents with  . discuss medication    HPI: Diana Wells is a 64 y.o. female who is here for  Medication refills She is doing well, her speech is better, but still has episodes when she has difficulty finding words and she gets excited or is rushed. She denies CP, SOB, dizziness, vision changes, palpitations, pedal edema, worsening numbness/tingling. She has a lot of bills to pay from the hospital and was wondering if she can decrease her office visits. I have been seeing her twice a year.  The Diovan is also very expensive for her.  Past Medical History  Diagnosis Date  . Anxiety   . Hypertension   . Stroke     8/15 earlier in month  . Aphasia     slight aphasia  . GERD (gastroesophageal reflux disease)   . Peripheral vascular disease     ? in arm lft  . Vertebral artery aneurysm     left VA aneursym by 07/2014 MRA (further eval by Dr. Conchita ParisNundkumar following L CEA surgery)   Past Surgical History  Procedure Laterality Date  . Endarterectomy Left 08/03/2014    Procedure: LEFT CAROTID ENDARTERECTOMY  WITH PATCH ANGIOPLASTY;  Surgeon: Nada LibmanVance W Brabham, MD;  [facility];  Service: Vascular;  Laterality: Left;   History   Social History  . Marital Status: Divorced    Spouse Name: N/A  . Number of Children: 1  . Years of Education: college   Occupational History  . retired    Social History Main Topics  . Smoking status: Former Smoker -- 0.25 packs/day for 47 years    Types: Cigarettes    Quit date: 07/19/2014  . Smokeless tobacco: Never Used  . Alcohol Use: No  . Drug Use: No  . Sexual Activity: Not on file   Other Topics Concern  . None   Social History Narrative   Patient is divorced with one child.   Patient is right handed.   Patient has college education.   Patient drinks 2 cups daily.   Family History  Problem Relation Age of Onset  . Heart failure Mother   . Hypertension Maternal Grandfather   .  Heart attack Neg Hx    Allergies  Allergen Reactions  . Codeine Other (See Comments)    Makes patient incoherent and involuntarily perform acts that she does not remember.   . Erythromycin Itching and Rash    Hallucinations  . Penicillins Itching and Rash    Hallucinations   . Eggs Or Egg-Derived Products     Per pt "severely allergic" gets really sick  . Lisinopril Cough  . Dopamine Hcl Nausea Only and Rash   Prior to Admission medications   Medication Sig Start Date End Date Taking? Authorizing Provider  amLODipine (NORVASC) 10 MG tablet Take 1 tablet (10 mg total) by mouth daily. 09/05/14  Yes Marvel PlanJindong Xu, MD  aspirin EC 81 MG EC tablet Take 1 tablet (81 mg total) by mouth daily. 07/19/14  Yes Joanna Puffrystal S Dorsey, MD  atorvastatin (LIPITOR) 80 MG tablet TAKE 1 TABLET BY MOUTH DAILY AT 6:00PM  "OV NEEDED FOR FURTHER REFILLS" 04/17/15  Yes Chelle Jeffery, PA-C  ibuprofen (ADVIL,MOTRIN) 200 MG tablet Take 200 mg by mouth every 12 (twelve) hours as needed for moderate pain.    Yes Historical Provider, MD  loratadine (CLARITIN) 10 MG tablet Take 10 mg by mouth daily as needed for allergies.  Yes Historical Provider, MD  valsartan (DIOVAN) 80 MG tablet Take 1 tablet (80 mg total) by mouth daily. 09/05/14  Yes Marvel PlanJindong Xu, MD     ROS: The patient denies fevers, chills, night sweats, unintentional weight loss, chest pain, palpitations, wheezing, dyspnea on exertion, nausea, vomiting, abdominal pain, dysuria, hematuria, melena, numbness, weakness, or tingling.  All other systems have been reviewed and were otherwise negative with the exception of those mentioned in the HPI and as above.    PHYSICAL EXAM: Filed Vitals:   05/01/15 1340  BP: 140/80  Pulse: 95  Temp: 97.9 F (36.6 C)  Resp: 16   Filed Vitals:   05/01/15 1340  Height: 5' (1.524 m)  Weight: 151 lb 9.6 oz (68.765 kg)   Body mass index is 29.61 kg/(m^2).  General: Alert, no acute distress HEENT:  Normocephalic, atraumatic,  oropharynx patent. EOMI, PERRLA Cardiovascular:  Regular rate and rhythm, no rubs murmurs or gallops.  No Carotid bruits status post left carotid endarterectomy, radial pulse intact. No pedal edema.  Respiratory: Clear to auscultation bilaterally.  No wheezes, rales, or rhonchi.  No cyanosis, no use of accessory musculature GI: No organomegaly, abdomen is soft and non-tender, positive bowel sounds.  No masses. Skin: No rashes. Neurologic: Facial musculature symmetric. She has at times difficulty finding words and it normal timely manner.  She eventually finds them but when she is rash or excited it takes her little bit longer. This is vastly improved from the last time I saw her Psychiatric: Patient is appropriate throughout our interaction. Lymphatic: No cervical lymphadenopathy Musculoskeletal: Gait intact. 5 out of 5 strength, sensation intact.   LABS: Results for orders placed or performed during the hospital encounter of 08/03/14  CBC  Result Value Ref Range   WBC 10.6 (H) 4.0 - 10.5 K/uL   RBC 3.85 (L) 3.87 - 5.11 MIL/uL   Hemoglobin 11.3 (L) 12.0 - 15.0 g/dL   HCT 32.433.9 (L) 40.136.0 - 02.746.0 %   MCV 88.1 78.0 - 100.0 fL   MCH 29.4 26.0 - 34.0 pg   MCHC 33.3 30.0 - 36.0 g/dL   RDW 25.313.3 66.411.5 - 40.315.5 %   Platelets 239 150 - 400 K/uL  Basic metabolic panel  Result Value Ref Range   Sodium 138 137 - 147 mEq/L   Potassium 4.3 3.7 - 5.3 mEq/L   Chloride 104 96 - 112 mEq/L   CO2 23 19 - 32 mEq/L   Glucose, Bld 97 70 - 99 mg/dL   BUN 6 6 - 23 mg/dL   Creatinine, Ser 4.740.67 0.50 - 1.10 mg/dL   Calcium 8.5 8.4 - 25.910.5 mg/dL   GFR calc non Af Amer >90 >90 mL/min   GFR calc Af Amer >90 >90 mL/min   Anion gap 11 5 - 15  CBC  Result Value Ref Range   WBC 16.1 (H) 4.0 - 10.5 K/uL   RBC 4.10 3.87 - 5.11 MIL/uL   Hemoglobin 12.3 12.0 - 15.0 g/dL   HCT 56.335.9 (L) 87.536.0 - 64.346.0 %   MCV 87.6 78.0 - 100.0 fL   MCH 30.0 26.0 - 34.0 pg   MCHC 34.3 30.0 - 36.0 g/dL   RDW 32.913.1 51.811.5 - 84.115.5 %   Platelets  313 150 - 400 K/uL  Creatinine, serum  Result Value Ref Range   Creatinine, Ser 0.65 0.50 - 1.10 mg/dL   GFR calc non Af Amer >90 >90 mL/min   GFR calc Af Amer >90 >90 mL/min  EKG/XRAY:   Primary read interpreted by Dr. Conley Rolls at [facility]   ASSESSMENT/PLAN: Encounter Diagnoses  Name Primary?  . Benign essential HTN Yes  . Essential hypertension   . Hyperlipidemia   . 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 Expressive aphasia   . H/O: stroke with residual effects    Diana Wells is a pleasant 64 year old female with a past medical history of hypertension, hyperlipidemia, stroke, vertebral aneurysm, expressive aphasia who is here for medication refills. Labs pending Prescriptions were printed for the patient to refill for 1 year. Follow-up with specialists as scheduled Follow-up in 6 months to one year  Gross sideeffects, risk and benefits, and alternatives of medications d/w patient. Patient is aware that all medications have potential sideeffects and we are unable to predict every sideeffect or drug-drug interaction that may occur.  Hamilton Capri PHUONG, DO 05/01/2015 2:32 PM

## 2015-05-03 ENCOUNTER — Telehealth: Payer: Self-pay | Admitting: Family Medicine

## 2015-05-03 ENCOUNTER — Other Ambulatory Visit: Payer: Self-pay | Admitting: Family Medicine

## 2015-05-03 ENCOUNTER — Telehealth: Payer: Self-pay

## 2015-05-03 DIAGNOSIS — R748 Abnormal levels of other serum enzymes: Secondary | ICD-10-CM

## 2015-05-03 DIAGNOSIS — E785 Hyperlipidemia, unspecified: Secondary | ICD-10-CM

## 2015-05-03 NOTE — Telephone Encounter (Signed)
Patient is wants to leave a message for Dr. Conley RollsLe. She would like to give Dr. Conley RollsLe some important information about her labs. Please call! [phone]

## 2015-05-03 NOTE — Telephone Encounter (Signed)
Spoke with patient, she forgot to mention she still feels tired some and some confusion when she works out, will cont with [employer] , she is to return in 1 month for repeat labs , labs pnly

## 2015-05-03 NOTE — Telephone Encounter (Signed)
Patient will come in in 2 months for repeat CMP due to elevated liver enzymes. Only blood work done. If her enzymes are elevated then I will also add hepatitis panel workup. We may have to consider cutting down her statin from 80 mg to 40 mg if super elevated on next blood work. Right now I will keep as is since she is doing so well on the medication without any side effects except for elevated liver enzymes. It is not 3 times above normal and her cholesterol is very well controlled.

## 2015-05-23 ENCOUNTER — Encounter: Payer: Self-pay | Admitting: *Deleted

## 2015-06-28 ENCOUNTER — Ambulatory Visit (INDEPENDENT_AMBULATORY_CARE_PROVIDER_SITE_OTHER): Payer: 59 | Admitting: Neurology

## 2015-06-28 ENCOUNTER — Encounter: Payer: Self-pay | Admitting: Neurology

## 2015-06-28 VITALS — BP 136/75 | HR 76 | Ht 60.0 in | Wt 155.8 lb

## 2015-06-28 DIAGNOSIS — I671 Cerebral aneurysm, nonruptured: Secondary | ICD-10-CM

## 2015-06-28 DIAGNOSIS — I10 Essential (primary) hypertension: Secondary | ICD-10-CM | POA: Diagnosis not present

## 2015-06-28 DIAGNOSIS — E785 Hyperlipidemia, unspecified: Secondary | ICD-10-CM | POA: Diagnosis not present

## 2015-06-28 DIAGNOSIS — I639 Cerebral infarction, unspecified: Secondary | ICD-10-CM

## 2015-06-28 DIAGNOSIS — I6522 Occlusion and stenosis of left carotid artery: Secondary | ICD-10-CM

## 2015-06-28 DIAGNOSIS — I6350 Cerebral infarction due to unspecified occlusion or stenosis of unspecified cerebral artery: Secondary | ICD-10-CM | POA: Diagnosis not present

## 2015-06-28 NOTE — Patient Instructions (Addendum)
-   continue ASA and lipitor for stroke prevention  - continue amlodipine and diovan for BP control and check BP at home - Follow up with your primary care physician for stroke risk factor modification. Recommend maintain blood pressure goal <130/80, diabetes with hemoglobin A1c goal below 6.5% and lipids with LDL cholesterol goal below 70 mg/dL.  - continue abstain from smoking - RTC in 6 months

## 2015-06-28 NOTE — Progress Notes (Signed)
STROKE NEUROLOGY FOLLOW UP NOTE  NAME: Diana Wells DOB: 07-22-1951  REASON FOR VISIT: stroke follow up HISTORY FROM: pt and chart  Today we had the pleasure of seeing Diana Wells in follow-up at our Neurology Clinic. Pt was accompanied by no one.   History Summary Diana Wells is an 64 y.o. female, right handed, with a past medical history significant for HTN, smoking, and anxiety, was admitted on 07/17/2014 for language impairment, and confusion. CT brain revealed hypodensities within the left basal ganglia and within the left occipital lobe. 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 CTA neck and carotid doppler showed left ICA highly ulcerated plaque with 70-80% stenosis. Vascular surgery consulted and CEA planned before discharge home. She was put on ASA and plavix. 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 Conchita Paris as outpt. She was discharged in stable condition but still has expressive aphasia.  Follow up 08/16/14 - the patient has been doing better. She still has partial expressive aphasia and word finding difficulties but slightly better than when she was discharged. She continued to have speech therapy twice a week, and she thought it helps her speech. However her Plavix was stopped, and the patient does not know who stopped it. She is on aspirin and Lipitor for stroke prevention now.  She followed up with Dr. Conchita Paris and decided no surgery needed. She also followed up with Dr. Javier Docker and had left CEA on 08/06/14 without complications. Wound healed well.  She reported that she has intermittent blurry vision at right eye. She does have b/l visual decline and not see her eye doctor for eye check for a while and glasses is out of date, but she found that she started to have right eye blurry vision since stroke. The blurry vision just seems look through a glass on the right eye, not loss of vision, usually in the morning shortly  after taking HCTZ, could lasting 15-66min to hours or a day.  She also complains of cough since discharge and started taking lisinopril, dry cough, no sputum and sometimes cough causing nausea and vomiting.   Follow up 11/15/14 - she is doing much better on speech. She is much fluent and able to get words out most of the time. She still has some paraphasic errors and hesitancy when she felt stress, anxious or on pressure. She has been discharged from speech therapy and currently doing home exercise. Her BP today 149/80 in clinic but she said she monitored her BP at home which was 120s/70s. She is compliant with medication. No more dry cough  Interval History During the interval time, pt has been doing well with speech. Much improved, able to have fluent speech if slow down. Still has difficulty with words out when having allergy, tired, stressful condition as well as agitation. In good mood and BP 136/75. Had repeat CUS with Dr. Myra Gianotti and left ICA patent. Recent check of LDL is down to 90 from last year 197.  REVIEW OF SYSTEMS: Full 14 system review of systems performed and notable only for those listed below and in HPI above, all others are negative:  Constitutional: N/A  Cardiovascular: N/A  Ear/Nose/Throat: N/A  Skin: N/A   Eyes:   Respiratory: N/A  Gastroitestinal: N/A  Genitourinary: N/A Hematology/Lymphatic: N/A  Endocrine: N/A  Musculoskeletal: N/A  Allergy/Immunology: N/A Neurological: speech difficulty Psychiatric: agitation, nervous/anxious  The following represents the patient's updated allergies and side  effects list: Allergies  Allergen Reactions  . Codeine Other (See Comments)    Makes patient incoherent and involuntarily perform acts that she does not remember.   . Erythromycin Itching and Rash    Hallucinations  . Penicillins Itching and Rash    Hallucinations   . Eggs Or Egg-Derived Products     Per pt "severely allergic" gets really sick  . Lisinopril Cough  .  Dopamine Hcl Nausea Only and Rash    Labs since last visit of relevance include the following: Results for orders placed or performed in visit on 05/01/15  COMPLETE METABOLIC PANEL WITH GFR  Result Value Ref Range   Sodium 137 135 - 145 mEq/L   Potassium 4.1 3.5 - 5.3 mEq/L   Chloride 103 96 - 112 mEq/L   CO2 23 19 - 32 mEq/L   Glucose, Bld 98 70 - 99 mg/dL   BUN 9 6 - 23 mg/dL   Creat 1.61 0.96 - 0.45 mg/dL   Total Bilirubin 0.6 0.2 - 1.2 mg/dL   Alkaline Phosphatase 132 (H) 39 - 117 U/L   AST 47 (H) 0 - 37 U/L   ALT 62 (H) 0 - 35 U/L   Total Protein 7.8 6.0 - 8.3 g/dL   Albumin 4.8 3.5 - 5.2 g/dL   Calcium 9.7 8.4 - 40.9 mg/dL   GFR, Est African American >89 mL/min   GFR, Est Non African American >89 mL/min  Lipid panel  Result Value Ref Range   Cholesterol 150 0 - 200 mg/dL   Triglycerides 811 <914 mg/dL   HDL 36 (L) >=78 mg/dL   Total CHOL/HDL Ratio 4.2 Ratio   VLDL 24 0 - 40 mg/dL   LDL Cholesterol 90 0 - 99 mg/dL  TSH  Result Value Ref Range   TSH 0.621 0.350 - 4.500 uIU/mL    The neurologically relevant items on the patient's problem list were reviewed on today's visit.  Neurologic Examination  A problem focused neurological exam (12 or more points of the single system neurologic examination, vital signs counts as 1 point, cranial nerves count for 8 points) was performed.  Blood pressure 136/75, pulse 76, height 5' (1.524 m), weight 155 lb 12.8 oz (70.67 kg).  General - Well nourished, well developed, in no apparent distress.  Ophthalmologic - Sharp disc margins OU.  Cardiovascular - Regular rate and rhythm with no murmur.  Mental Status -  Level of arousal and orientation to time, place, and person were intact. Language comprehension, repetition and naming without problem, however she still has very mild expressive aphasia with paraphasic errors if speak fast, much improved than last visit.  Cranial Nerves II - XII - II - Visual field intact OU. III,  IV, VI - Extraocular movements intact. V - Facial sensation intact bilaterally. VII - Facial movement intact bilaterally. VIII - Hearing & vestibular intact bilaterally. X - Palate elevates symmetrically. XI - Chin turning & shoulder shrug intact bilaterally. XII - Tongue protrusion intact.  Motor Strength - The patient's strength was normal in all extremities and pronator drift was absent.  Bulk was normal and fasciculations were absent.   Motor Tone - Muscle tone was assessed at the neck and appendages and was normal.  Reflexes - The patient's reflexes were normal in all extremities and she had no pathological reflexes.  Sensory - Light touch, temperature/pinprick and Romberg testing were assessed and were normal.    Coordination - The patient had normal movements in the hands and  feet with no ataxia or dysmetria.  Tremor was absent.  Gait and Station - The patient's transfers, posture, gait, station, and turns were observed as normal.  Data reviewed: I personally reviewed the images and agree with the radiology interpretations.  CT of the brain 07/17/2014 Hypodensities within the left basal ganglia and within the left occipital lobe which can be seen with age-indeterminate/likely subacute infarcts. There is mild expansion of the left basal ganglia with associated mass effect. As these represent different vascular distributions (MCA and PCA) recommend further evaluation with MRI/MRA.  MRI of the brain 07/18/2014 1. Multi focal acute ischemic left MCA territory ischemic infarcts involving the left basal ganglia and cortical and subcortical white matter of the left parietal and temporal lobes. No significant mass effect or evidence of hemorrhagic conversion. 2. Moderate chronic small vessel ischemic changes involving the supratentorial white matter and pons.  MRA of the brain 07/18/2014 1. Severe high-grade stenosis of at least 80% within the distal left M1 segment, measuring approximately 7 mm in  length. 2. 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 3. Moderate multi focal atherosclerotic irregularity within the cavernous segments of the internal carotid arteries bilaterally and left vertebral artery.  CT Angio Neck 07/18/2014 1. Complex highly ulcerated plaque at the left carotid bifurcation. Short-segment proximal left ICA stenosis of up to 70 -75%. See sagittal image 134 of series 404. 2. No other hemodynamically significant stenosis in the neck. Great vessel origin and right carotid bifurcation plaque. 3. Dominant dolichoectatic left vertebral artery. 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 4. Non dominant right vertebral artery with soft and calcified plaque intracranially resulting in high-grade stenosis just proximal to the right PICA origin, and then right vertebral artery occlusion distal to the PICA.  Carotid Doppler Bilateral: Vertebral artery flow is antegrade. Right = 1-39% ICA stenosis. Left = 60-79% ICA stenosis.  2D Echocardiogram EF 55-60% with no source of embolus.  CXR 07/18/2014 No edema or consolidation. Atherosclerotic change with areas of calcification in the aorta and left carotid artery.  EKG normal sinus rhythm, nonspecific ST and T waves changes  CUS 02/2015 - right < 40% and left patent.  Component     Latest Ref Rng 07/17/2014 07/18/2014 05/01/2015  Cholesterol     0 - 200 mg/dL  161 (H) 096  Triglycerides     <150 mg/dL  045 (H) 409  HDL Cholesterol     >=46 mg/dL  29 (L) 36 (L)  Total CHOL/HDL Ratio       9.0 4.2  VLDL     0 - 40 mg/dL  34 24  LDL (calc)     0 - 99 mg/dL  811 (H) 90  Hemoglobin A1C     <5.7 % 6.3 (H)    Mean Plasma Glucose     <117 mg/dL 914 (H)    TSH     7.829 - 4.500 uIU/mL   0.621    Assessment: As you may recall, she is a 64 y.o. Caucasian female with PMH of HTN, smoking, and anxiety, was admitted on 07/17/2014 for left MCA stroke,  found to have left ICA stenosis 80%. Put on ASA and plavix as well as lipitor. Had LICA CEA by Dr. Myra Gianotti on 08/06/14. Also followed with Dr. Conchita Paris for left VA fusiform aneurysm which does not need intervention at this time. She is on aspirin and Lipitor now. She complained of transient  blurry vision after taking HCTZ, and also dry cough which likely to be side effect from lisinopril. So her HCTZ and lisinopril were stopped and changed to novasc and diovan, so far BP controlled well at home. Speech much improved. Repeat CUS 02/2015 normal.   Plan:  - continue ASA and lipitor for stroke prevention  - continue amlodipine and diovan for BP control and check BP at home - Follow up with your primary care physician for stroke risk factor modification. Recommend maintain blood pressure goal <130/80, diabetes with hemoglobin A1c goal below 6.5% and lipids with LDL cholesterol goal below 70 mg/dL.  - continue abstain from smoking - continue to follow up with vascular surgery - RTC in 6 months  No orders of the defined types were placed in this encounter.    Patient Instructions  - continue ASA and lipitor for stroke prevention  - continue amlodipine and diovan for BP control and check BP at home - Follow up with your primary care physician for stroke risk factor modification. Recommend maintain blood pressure goal <130/80, diabetes with hemoglobin A1c goal below 6.5% and lipids with LDL cholesterol goal below 70 mg/dL.  - continue abstain from smoking - RTC in 6 months    Marvel PlanJindong Cyrena Kuchenbecker, MD PhD [facility] [address], [address] [place], [place] [address] [phone]

## 2015-10-21 IMAGING — CT CT ANGIO NECK
1 of 10 series · 1 of 33 positions shown · IV contrast (Iohexol (Omnipaque 350))
Comparison: Brain MRI and MRA 07/17/2014.

CLINICAL DATA: 62-year-old female with slurred speech, confusion,
hypertension. Multiple acute infarcts in the left hemisphere,
primarily the left MCA territory. Initial encounter.

EXAM:
CT ANGIOGRAPHY NECK
TECHNIQUE: Multidetector CT imaging of the neck was performed using the
standard protocol during bolus administration of intravenous
contrast. Multiplanar CT image reconstructions and MIPs were
obtained to evaluate the vascular anatomy. Carotid stenosis
measurements (when applicable) are obtained utilizing NASCET
criteria, using the distal internal carotid diameter as the
denominator.
CONTRAST:  50mL OMNIPAQUE IOHEXOL 350 MG/ML SOLN

[Series 200: locator · axial · 0.49mm/px · 1 of 1 slices shown]
[im 1/1  soft-tissue]
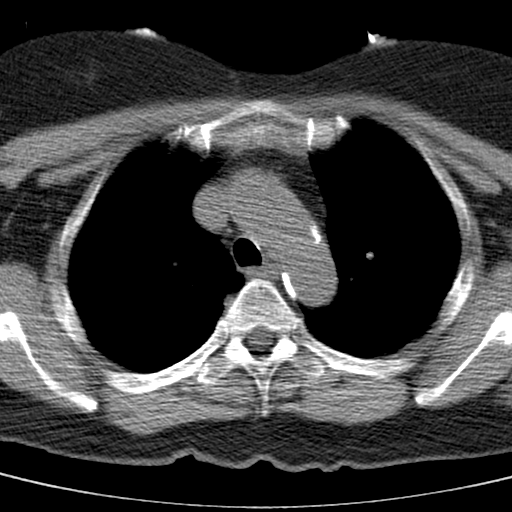

[1 of 33 positions shown; findings below may reference images not displayed]

FINDINGS: Negative lung apices. No superior mediastinal lymphadenopathy.
Negative thyroid, larynx, pharynx, parapharyngeal spaces,
retropharyngeal space, sublingual space, submandibular glands,
parotid glands, and visualized orbits soft tissues. Visualized
paranasal sinuses and mastoids are clear. No acute osseous
abnormality identified. No cervical lymphadenopathy.

VASCULAR FINDINGS:

Three vessel arch configuration with moderate calcified plaque at
the great vessel origins, sparing the right brachiocephalic origin
(mild soft plaque).

No right CCA origin stenosis. Negative right CCA proximal to the
bifurcation. Soft and calcified plaque at the right carotid
bifurcation, but no cervical right ICA stenosis.

Calcified plaque proximal right subclavian artery without
hemodynamically significant stenosis. Soft plaque right vertebral
artery origin and proximal V1 segment without focal stenosis. The
right vertebral artery then is non dominant throughout the neck. It
is patent to the skullbase. Intracranially there is mild fusiform
enlargement of the right V4 segment with calcified and soft plaque
resulting in moderate to severe stenosis just proximal to the right
PICA origin. The right vertebral artery of appears functionally
occluded be on the right PICA.

Calcified plaque at the left CCA origin without hemodynamically
significant stenosis. Negative left CCA proximal to the low left
carotid bifurcation.

Complex soft and ulcerated plaque at the left carotid bifurcation
(series 401, image 50 and sagittal image 134) resulting in short
segment stenosis of up to 70 disc 75 % with respect to the distal
vessel. Ulcerated irregular plaque extending posteriorly. Just be on
the bulb level, there is abrupt tortuosity of the left ICA with a
mildly kinked appearance. Beyond this level, the cervical left ICA
is negative.

Moderate bilateral ICA siphon atherosclerosis noted.

Calcified plaque proximal left subclavian artery without
hemodynamically significant stenosis. Dominant, no origin stenosis.
And dolichoectatic left vertebral artery the left vertebral artery
is dominant and widely patent throughout the neck and to the
skullbase. Intracranially the distal left vertebral artery calcified
mostly fusiform aneurysm is re- identified, see MRA report.

Review of the MIP images confirms the above findings.
IMPRESSION: 1. Complex highly ulcerated plaque at the left carotid bifurcation.
Short-segment proximal left ICA stenosis of up to 70 -75%. See
sagittal image 134 of series [DATE]. No other hemodynamically significant stenosis in the neck. Great
vessel origin and right carotid bifurcation plaque.
3. Dominant dolichoectatic left vertebral artery. Intracranial left
vertebral artery fusiform aneurysm re-identified (see yesterday's
MRA).
4. Non dominant right vertebral artery with soft and calcified
plaque intracranially resulting in high-grade stenosis just proximal
to the right PICA origin, and then right vertebral artery occlusion
distal to the PICA.

## 2015-10-24 ENCOUNTER — Telehealth: Payer: Self-pay | Admitting: Family Medicine

## 2015-10-24 NOTE — Telephone Encounter (Signed)
Left a message for patient to return call to schedule a mammogram. 

## 2015-11-27 ENCOUNTER — Telehealth: Payer: Self-pay

## 2015-11-27 NOTE — Telephone Encounter (Signed)
LFt message to see if patient can be seen on 11-29-15 before 12pm. Rn also was trying to see if patient could at a later time on Januarey 23,2017 per Dr. Roda ShuttersXu request.

## 2015-11-28 NOTE — Telephone Encounter (Signed)
Rn was calling patient to change appt time for January 23. Pt stated she will need to cancel the January 23 appt anyway because she may not have her new insurance for the year. Pt will call back and reschedule when she gets her new insurance.

## 2015-12-25 ENCOUNTER — Other Ambulatory Visit: Payer: Self-pay | Admitting: Family Medicine

## 2015-12-26 ENCOUNTER — Telehealth: Payer: Self-pay

## 2015-12-26 DIAGNOSIS — I10 Essential (primary) hypertension: Secondary | ICD-10-CM

## 2015-12-26 NOTE — Telephone Encounter (Signed)
PT called asking about blood pressure rx renewal// advised since last OV was in 06/2015 PT would most likely need to be seen before renewal  Pharmacy:  [id number], [place] [address]  [phone] Please call to advise

## 2015-12-27 MED ORDER — AMLODIPINE BESYLATE 10 MG PO TABS: 10.0000 mg | ORAL_TABLET | Freq: Every day | ORAL | Status: DC

## 2015-12-27 MED ORDER — VALSARTAN 80 MG PO TABS: 80.0000 mg | ORAL_TABLET | Freq: Every day | ORAL | Status: DC

## 2015-12-27 NOTE — Telephone Encounter (Signed)
Rx's sent in. Pt notified. 

## 2016-01-06 ENCOUNTER — Ambulatory Visit: Payer: 59 | Admitting: Neurology

## 2016-02-26 ENCOUNTER — Encounter: Payer: Self-pay | Admitting: Family

## 2016-02-28 ENCOUNTER — Telehealth: Payer: Self-pay

## 2016-02-28 NOTE — Telephone Encounter (Signed)
Pt. called to inquire if she can delay her carotid surveillance until her Medicare starts in September, due to financial situation and outstanding bills.  Denied having any neurological symptoms/ changes.  Stated "I feel good; I work out every other day; I'm watching my blood pressure, and it has been good; I am watching my diet; I am taking Lipitor;  I feel the best I have felt for awhile."   Advised as long as she is not experiencing any symptoms, it should be all right to delay her surveillance study.  Advised I will forward this message to the Nurse Practitioner to review, and get her recommendation.

## 2016-03-02 ENCOUNTER — Ambulatory Visit: Payer: Self-pay | Admitting: Family

## 2016-03-02 ENCOUNTER — Encounter (HOSPITAL_COMMUNITY): Payer: Self-pay

## 2016-03-02 ENCOUNTER — Other Ambulatory Visit: Payer: Self-pay

## 2016-03-02 ENCOUNTER — Encounter (HOSPITAL_COMMUNITY): Payer: 59

## 2016-03-02 DIAGNOSIS — I779 Disorder of arteries and arterioles, unspecified: Secondary | ICD-10-CM

## 2016-03-02 DIAGNOSIS — I739 Peripheral vascular disease, unspecified: Principal | ICD-10-CM

## 2016-03-02 NOTE — Telephone Encounter (Signed)
The current carotid order expires in June 2017- please enter new order for Carotid Duplex extending to September.  Thank you! Annabelle Harmanana

## 2016-03-02 NOTE — Telephone Encounter (Signed)
Spoke with pt to schedule, dpm °

## 2016-03-06 ENCOUNTER — Ambulatory Visit (INDEPENDENT_AMBULATORY_CARE_PROVIDER_SITE_OTHER): Payer: BLUE CROSS/BLUE SHIELD | Admitting: Neurology

## 2016-03-06 ENCOUNTER — Encounter: Payer: Self-pay | Admitting: Neurology

## 2016-03-06 VITALS — BP 146/78 | HR 84 | Ht 60.0 in | Wt 164.0 lb

## 2016-03-06 DIAGNOSIS — I10 Essential (primary) hypertension: Secondary | ICD-10-CM | POA: Diagnosis not present

## 2016-03-06 DIAGNOSIS — I6522 Occlusion and stenosis of left carotid artery: Secondary | ICD-10-CM

## 2016-03-06 DIAGNOSIS — I671 Cerebral aneurysm, nonruptured: Secondary | ICD-10-CM | POA: Diagnosis not present

## 2016-03-06 DIAGNOSIS — E785 Hyperlipidemia, unspecified: Secondary | ICD-10-CM

## 2016-03-06 DIAGNOSIS — I639 Cerebral infarction, unspecified: Secondary | ICD-10-CM | POA: Diagnosis not present

## 2016-03-06 NOTE — Patient Instructions (Addendum)
-   continue ASA and lipitor for stroke prevention  - continue amlodipine and diovan for BP control and check BP at home and record - Follow up with your primary care physician for stroke risk factor modification. Recommend maintain blood pressure goal around 130/80, diabetes with hemoglobin A1c goal below 6.5% and lipids with LDL cholesterol goal below 70 mg/dL.  - continue abstain from smoking - continue to follow up with vascular surgery - target HR on exercise 120-130, max 155 - speech exercise at home - follow up as needed

## 2016-03-06 NOTE — Progress Notes (Signed)
STROKE NEUROLOGY FOLLOW UP NOTE  NAME: Diana Wells DOB: 08/13/1951  REASON FOR VISIT: stroke follow up HISTORY FROM: pt and chart  Today we had the pleasure of seeing Diana Wells in follow-up at our Neurology Clinic. Pt was accompanied by no one.   History Summary Diana Wells is an 65 y.o. female, right handed, with a past medical history significant for HTN, smoking, and anxiety, was admitted on 07/17/2014 for language impairment, and confusion. CT brain revealed hypodensities within the left basal ganglia and within the left occipital lobe. 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 CTA neck and carotid doppler showed left ICA highly ulcerated plaque with 70-80% stenosis. Vascular surgery consulted and CEA planned before discharge home. She was put on ASA and plavix. 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 Conchita ParisNundkumar as outpt. She was discharged in stable condition but still has expressive aphasia.  Follow up 08/16/14 - the patient has been doing better. She still has partial expressive aphasia and word finding difficulties but slightly better than when she was discharged. She continued to have speech therapy twice a week, and she thought it helps her speech. However her Plavix was stopped, and the patient does not know who stopped it. She is on aspirin and Lipitor for stroke prevention now.  She followed up with Dr. Conchita ParisNundkumar and decided no surgery needed. She also followed up with Dr. Javier DockerBrabhma and had left CEA on 08/06/14 without complications. Wound healed well.  She reported that she has intermittent blurry vision at right eye. She does have b/l visual decline and not see her eye doctor for eye check for a while and glasses is out of date, but she found that she started to have right eye blurry vision since stroke. The blurry vision just seems look through a glass on the right eye, not loss of vision, usually in the morning shortly  after taking HCTZ, could lasting 15-10920min to hours or a day.  She also complains of cough since discharge and started taking lisinopril, dry cough, no sputum and sometimes cough causing nausea and vomiting.   Follow up 11/15/14 - she is doing much better on speech. She is much fluent and able to get words out most of the time. She still has some paraphasic errors and hesitancy when she felt stress, anxious or on pressure. She has been discharged from speech therapy and currently doing home exercise. Her BP today 149/80 in clinic but she said she monitored her BP at home which was 120s/70s. She is compliant with medication. No more dry cough  Follow up 06/28/15 - pt has been doing well with speech. Much improved, able to have fluent speech if slow down. Still has difficulty with words out when having allergy, tired, stressful condition as well as agitation. In good mood and BP 136/75. Had repeat CUS with Dr. Myra GianottiBrabham and left ICA patent. Recent check of LDL is down to 90 from last year 197.  Interval History During the interval time, pt has been doing well. Speech improved but plateaued. Will follow up with [facility] in September. Continue speech exercise at home. BP ranging from 110-150 at home. Today 146/78. Has appointment with PCP next week.      REVIEW OF SYSTEMS: Full 14 system review of systems performed and notable only for those listed below and in HPI above, all others are negative:  Constitutional: fatigue  Cardiovascular: N/A  Ear/Nose/Throat:  riinging in ears  Skin: N/A   Eyes:   Respiratory: N/A  Gastroitestinal: N/A  Genitourinary: N/A Hematology/Lymphatic: N/A  Endocrine: N/A  Musculoskeletal: joint pain  Allergy/Immunology: N/A Neurological: speech difficulty, memory loss Psychiatric: depression, nervous/anxious  The following represents the patient's updated allergies and side effects list: Allergies  Allergen Reactions  . Codeine Other (See Comments)    Makes patient incoherent  and involuntarily perform acts that she does not remember.   . Erythromycin Itching and Rash    Hallucinations  . Penicillins Itching and Rash    Hallucinations   . Eggs Or Egg-Derived Products     Per pt "severely allergic" gets really sick  . Lisinopril Cough  . Dopamine Hcl Nausea Only and Rash    Labs since last visit of relevance include the following: Results for orders placed or performed in visit on 05/01/15  COMPLETE METABOLIC PANEL WITH GFR  Result Value Ref Range   Sodium 137 135 - 145 mEq/L   Potassium 4.1 3.5 - 5.3 mEq/L   Chloride 103 96 - 112 mEq/L   CO2 23 19 - 32 mEq/L   Glucose, Bld 98 70 - 99 mg/dL   BUN 9 6 - 23 mg/dL   Creat 6.04 5.40 - 9.81 mg/dL   Total Bilirubin 0.6 0.2 - 1.2 mg/dL   Alkaline Phosphatase 132 (H) 39 - 117 U/L   AST 47 (H) 0 - 37 U/L   ALT 62 (H) 0 - 35 U/L   Total Protein 7.8 6.0 - 8.3 g/dL   Albumin 4.8 3.5 - 5.2 g/dL   Calcium 9.7 8.4 - 19.1 mg/dL   GFR, Est African American >89 mL/min   GFR, Est Non African American >89 mL/min  Lipid panel  Result Value Ref Range   Cholesterol 150 0 - 200 mg/dL   Triglycerides 478 <295 mg/dL   HDL 36 (L) >=62 mg/dL   Total CHOL/HDL Ratio 4.2 Ratio   VLDL 24 0 - 40 mg/dL   LDL Cholesterol 90 0 - 99 mg/dL  TSH  Result Value Ref Range   TSH 0.621 0.350 - 4.500 uIU/mL    The neurologically relevant items on the patient's problem list were reviewed on today's visit.  Neurologic Examination  A problem focused neurological exam (12 or more points of the single system neurologic examination, vital signs counts as 1 point, cranial nerves count for 8 points) was performed.  Blood pressure 146/78, pulse 84, height 5' (1.524 m), weight 164 lb (74.39 kg).  General - Well nourished, well developed, in no apparent distress.  Ophthalmologic - Sharp disc margins OU.  Cardiovascular - Regular rate and rhythm with no murmur.  Mental Status -  Level of arousal and orientation to time, place, and person  were intact. Language comprehension, repetition and naming without problem, however she still has very mild expressive aphasia with occasional paraphasic errors and speech hesitancy if speak fast.  Cranial Nerves II - XII - II - Visual field intact OU. III, IV, VI - Extraocular movements intact. V - Facial sensation intact bilaterally. VII - Facial movement intact bilaterally. VIII - Hearing & vestibular intact bilaterally. X - Palate elevates symmetrically. XI - Chin turning & shoulder shrug intact bilaterally. XII - Tongue protrusion intact.  Motor Strength - The patient's strength was normal in all extremities and pronator drift was absent.  Bulk was normal and fasciculations were absent.   Motor Tone - Muscle tone was assessed at the neck and appendages and was  normal.  Reflexes - The patient's reflexes were normal in all extremities and she had no pathological reflexes.  Sensory - Light touch, temperature/pinprick and Romberg testing were assessed and were normal.    Coordination - The patient had normal movements in the hands and feet with no ataxia or dysmetria.  Tremor was absent.  Gait and Station - The patient's transfers, posture, gait, station, and turns were observed as normal.  Data reviewed: I personally reviewed the images and agree with the radiology interpretations.  CT of the brain 07/17/2014 Hypodensities within the left basal ganglia and within the left occipital lobe which can be seen with age-indeterminate/likely subacute infarcts. There is mild expansion of the left basal ganglia with associated mass effect. As these represent different vascular distributions (MCA and PCA) recommend further evaluation with MRI/MRA.  MRI of the brain 07/18/2014 1. Multi focal acute ischemic left MCA territory ischemic infarcts involving the left basal ganglia and cortical and subcortical white matter of the left parietal and temporal lobes. No significant mass effect or evidence of  hemorrhagic conversion. 2. Moderate chronic small vessel ischemic changes involving the supratentorial white matter and pons.  MRA of the brain 07/18/2014 1. 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 2. 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 3. Moderate multi focal atherosclerotic irregularity within the cavernous segments of the internal carotid arteries bilaterally and left vertebral artery.  CT Angio Neck 07/18/2014 1. Complex highly ulcerated plaque at the left carotid bifurcation. Short-segment proximal left ICA stenosis of up to 70 -75%. See sagittal image 134 of series 404. 2. No other hemodynamically significant stenosis in the neck. Great vessel origin and right carotid bifurcation plaque. 3. Dominant dolichoectatic left vertebral artery. 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 4. Non dominant right vertebral artery with soft and calcified plaque intracranially resulting in high-grade stenosis just proximal to the right PICA origin, and then right vertebral artery occlusion distal to the PICA.  Carotid Doppler Bilateral: Vertebral artery flow is antegrade. Right = 1-39% ICA stenosis. Left = 60-79% ICA stenosis.  2D Echocardiogram EF 55-60% with no source of embolus.  CXR 07/18/2014 No edema or consolidation. Atherosclerotic change with areas of calcification in the aorta and left carotid artery.  EKG normal sinus rhythm, nonspecific ST and T waves changes  CUS 02/2015 - right < 40% and left patent.  Component     Latest Ref Rng 07/17/2014 07/18/2014 05/01/2015  Cholesterol     0 - 200 mg/dL  283 (H) 662  Triglycerides     <150 mg/dL  947 (H) 654  HDL Cholesterol     >=46 mg/dL  29 (L) 36 (L)  Total CHOL/HDL Ratio       9.0 4.2  VLDL     0 - 40 mg/dL  34 24  LDL (calc)     0 - 99 mg/dL  650 (H) 90  Hemoglobin  A1C     <5.7 % 6.3 (H)    Mean Plasma Glucose     <117 mg/dL 354 (H)    TSH     6.568 - 4.500 uIU/mL   0.621    Assessment: As you may recall, she is a 65 y.o. Caucasian female with PMH of HTN, smoking, and anxiety, was admitted on 07/17/2014 for left MCA stroke, found to have left ICA stenosis 80%.  Put on ASA and plavix as well as lipitor. Had LICA CEA by Dr. Myra Gianotti on 08/06/14. Also followed with Dr. Conchita Paris for left VA fusiform aneurysm which does not need intervention at this time. She is on aspirin and Lipitor now. She complained of transient blurry vision after taking HCTZ, and also dry cough which likely to be side effect from lisinopril. So her HCTZ and lisinopril were stopped and changed to novasc and diovan, so far BP controlled well at home. Speech much improved. Repeat CUS 02/2015 normal. Doing well during the interval time.   Plan:  - continue ASA and lipitor for stroke prevention  - continue amlodipine and diovan for BP control and check BP at home and record - Follow up with your primary care physician for stroke risk factor modification. Recommend maintain blood pressure goal around 130/80, diabetes with hemoglobin A1c goal below 6.5% and lipids with LDL cholesterol goal below 70 mg/dL.  - continue abstain from smoking - continue to follow up with vascular surgery - target HR on exercise 120-130, max 155 - speech exercise at home - follow up as needed  No orders of the defined types were placed in this encounter.    Patient Instructions  - continue ASA and lipitor for stroke prevention  - continue amlodipine and diovan for BP control and check BP at home and record - Follow up with your primary care physician for stroke risk factor modification. Recommend maintain blood pressure goal around 130/80, diabetes with hemoglobin A1c goal below 6.5% and lipids with LDL cholesterol goal below 70 mg/dL.  - continue abstain from smoking - continue to follow up with vascular  surgery - target HR on exercise 120-130, max 155 - speech exercise at home - follow up as needed    Marvel Plan, MD PhD [facility] [address] [place], [place] [address] [phone]

## 2016-03-10 ENCOUNTER — Encounter: Payer: Self-pay | Admitting: Family Medicine

## 2016-03-10 DIAGNOSIS — F32A Depression, unspecified: Secondary | ICD-10-CM | POA: Insufficient documentation

## 2016-03-10 DIAGNOSIS — F329 Major depressive disorder, single episode, unspecified: Secondary | ICD-10-CM | POA: Insufficient documentation

## 2016-03-10 DIAGNOSIS — F419 Anxiety disorder, unspecified: Secondary | ICD-10-CM | POA: Insufficient documentation

## 2016-03-12 ENCOUNTER — Ambulatory Visit (INDEPENDENT_AMBULATORY_CARE_PROVIDER_SITE_OTHER): Payer: BLUE CROSS/BLUE SHIELD | Admitting: Physician Assistant

## 2016-03-12 ENCOUNTER — Other Ambulatory Visit: Payer: Self-pay | Admitting: Physician Assistant

## 2016-03-12 VITALS — BP 142/86 | HR 80 | Temp 97.4°F | Resp 18 | Ht 60.0 in | Wt 163.0 lb

## 2016-03-12 DIAGNOSIS — I671 Cerebral aneurysm, nonruptured: Secondary | ICD-10-CM

## 2016-03-12 DIAGNOSIS — I639 Cerebral infarction, unspecified: Secondary | ICD-10-CM | POA: Diagnosis not present

## 2016-03-12 DIAGNOSIS — F329 Major depressive disorder, single episode, unspecified: Secondary | ICD-10-CM

## 2016-03-12 DIAGNOSIS — I10 Essential (primary) hypertension: Secondary | ICD-10-CM

## 2016-03-12 DIAGNOSIS — I6522 Occlusion and stenosis of left carotid artery: Secondary | ICD-10-CM | POA: Diagnosis not present

## 2016-03-12 DIAGNOSIS — F419 Anxiety disorder, unspecified: Secondary | ICD-10-CM

## 2016-03-12 DIAGNOSIS — E785 Hyperlipidemia, unspecified: Secondary | ICD-10-CM | POA: Diagnosis not present

## 2016-03-12 DIAGNOSIS — F32A Depression, unspecified: Secondary | ICD-10-CM

## 2016-03-12 LAB — COMPLETE METABOLIC PANEL WITH GFR
ALBUMIN: 4.5 g/dL (ref 3.6–5.1)
ALK PHOS: 115 U/L (ref 33–130)
ALT: 26 U/L (ref 6–29)
AST: 25 U/L (ref 10–35)
BILIRUBIN TOTAL: 0.7 mg/dL (ref 0.2–1.2)
BUN: 8 mg/dL (ref 7–25)
CO2: 22 mmol/L (ref 20–31)
CREATININE: 0.64 mg/dL (ref 0.50–0.99)
Calcium: 9.3 mg/dL (ref 8.6–10.4)
Chloride: 104 mmol/L (ref 98–110)
GFR, Est African American: >89 mL/min (ref >=60)
GFR, Est Non African American: >89 mL/min (ref >=60)
GLUCOSE: 138 mg/dL — AB (ref 70–99)
Potassium: 4.1 mmol/L (ref 3.5–5.3)
SODIUM: 139 mmol/L (ref 135–146)
Total Protein: 6.9 g/dL (ref 6.1–8.1)

## 2016-03-12 LAB — LIPID PANEL
Cholesterol: 140 mg/dL (ref 125–200)
HDL: 42 mg/dL — AB (ref >=46)
LDL CALC: 75 mg/dL (ref <130)
Total CHOL/HDL Ratio: 3.3 Ratio (ref <=5.0)
Triglycerides: 113 mg/dL (ref <150)
VLDL: 23 mg/dL (ref <30)

## 2016-03-12 MED ORDER — VALSARTAN 80 MG PO TABS: 80.0000 mg | ORAL_TABLET | Freq: Every day | ORAL | Status: DC

## 2016-03-12 MED ORDER — AMLODIPINE BESYLATE 10 MG PO TABS: 10.0000 mg | ORAL_TABLET | Freq: Every day | ORAL | Status: DC

## 2016-03-12 MED ORDER — ATORVASTATIN CALCIUM 80 MG PO TABS: ORAL_TABLET | ORAL | Status: DC

## 2016-03-12 NOTE — Progress Notes (Signed)
Patient ID: Diana Wells MRN: [id number], DOB: [birth year], 65 y.o. Date of Encounter: @DATE @  Chief Complaint:  Chief Complaint  Patient presents with  . new pt est care    not CPE, needs med refills    HPI: 65 y.o. year old white female  presents with above.   She states that she was going to [facility] for her PCP for the past 1-1/2 years. Is reestablishing primary care here at our office. We are close for her-location.  She also reports that she just saw Neurology on Friday and "He released her and said to f/u with PCP" She also says he said "didn't want my BP too low and for me to check BP readings to review with PCP"  She does bring in blood pressure log.  This shows the following readings: Saturday--- 141/73, 145/74 Sunday 127/72, 136/68 Monday 136/72, 137/71, 110/60, 124/77 Tuesday 137/76, 128/66, 122/66 Wednesday 148/77, 134/83, 123/68  Later, on the day of this OV, I had time to review Neuro OV note from 03/06/16 And also reviewed her BP log. I called her on the phone and informed her that I had reviewed the neurologist note and that it states "recommend maintain blood pressure goal around 130/80". I discussed with her that it does not say anything further about any specific blood pressure readings and does not give extremely strict/tight parameters. I discussed that I think she can just check her blood pressure once a week. She responds, "Oh good, thank you so much. That was really scaring me".   She also comments that she has "PTSD--it is okay as long as she can exercise" and adds that she also has problems with claustrophobia. 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   Today I did  review her last office visit note with Neurology dated 03/06/16. That note does state that she can follow-up with them PRN. Their note includes:  " CT of the brain 07/17/2014 Hypodensities within the left basal ganglia and within the left occipital lobe which can be seen with age-indeterminate/likely subacute infarcts. There is mild expansion of the left basal ganglia with associated mass effect. As these represent different vascular distributions (MCA and PCA) recommend further evaluation with MRI/MRA.  MRI of the brain 07/18/2014 1. Multi focal acute ischemic left MCA territory ischemic infarcts involving the left basal ganglia and cortical and subcortical white matter of the left parietal and temporal lobes. No significant mass effect or evidence of hemorrhagic conversion. 2. Moderate chronic small vessel ischemic changes involving the supratentorial white matter and pons.  MRA of the brain 07/18/2014 1. 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 2. 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 3. Moderate multi focal atherosclerotic irregularity within the cavernous segments of the internal carotid arteries bilaterally and left vertebral artery.  CT Angio Neck 07/18/2014 1. Complex highly ulcerated plaque at the left carotid bifurcation. Short-segment proximal left ICA stenosis of up to 70 -75%. See sagittal image 134 of series 404. 2. No other hemodynamically significant stenosis in the neck. Great vessel origin and right  carotid bifurcation plaque. 3. Dominant dolichoectatic left vertebral artery. 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 4. Non dominant right vertebral artery with soft and calcified plaque intracranially resulting in high-grade stenosis just proximal to the right PICA origin, and then right vertebral artery occlusion  distal to the PICA.  Carotid Doppler Bilateral: Vertebral artery flow is antegrade. Right = 1-39% ICA stenosis. Left = 60-79% ICA stenosis.  2D Echocardiogram EF 55-60% with no source of embolus.  CXR 07/18/2014 No edema or consolidation. Atherosclerotic change with areas of calcification in the aorta and left carotid artery.  EKG normal sinus rhythm, nonspecific ST and T waves changes  CUS 02/2015 - right < 40% and left patent.   Assessment: As you may recall, she is a 65 y.o. Caucasian female with PMH of HTN, smoking, and anxiety, was admitted on 07/17/2014 for left MCA stroke, found to have left ICA stenosis 80%. Put on ASA and plavix as well as lipitor. Had LICA CEA by Dr. Myra Gianotti on 08/06/14. Also followed with Dr. Conchita Paris for left VA fusiform aneurysm which does not need intervention at this time. She is on aspirin and Lipitor now. She complained of transient blurry vision after taking HCTZ, and also dry cough which likely to be side effect from lisinopril. So her HCTZ and lisinopril were stopped and changed to novasc and diovan, so far BP controlled well at home. Speech much improved. Repeat CUS 02/2015 normal. Doing well during the interval time.   Plan:  - continue ASA and lipitor for stroke prevention  - continue amlodipine and diovan for BP control and check BP at home and record - Follow up with your primary care physician for stroke risk factor modification. Recommend maintain blood pressure goal around 130/80, diabetes with hemoglobin A1c goal below 6.5% and lipids with LDL cholesterol goal below 70 mg/dL.  - continue abstain from smoking - continue to follow up with vascular surgery - target HR on exercise 120-130, max 155 - speech exercise at home - follow up as needed  No orders of the defined types were placed in this encounter.   Patient Instructions  - continue ASA and lipitor for stroke prevention  - continue amlodipine and diovan for BP control and check BP at  home and record - Follow up with your primary care physician for stroke risk factor modification. Recommend maintain blood pressure goal around 130/80, diabetes with hemoglobin A1c goal below 6.5% and lipids with LDL cholesterol goal below 70 mg/dL.  - continue abstain from smoking - continue to follow up with vascular surgery - target HR on exercise 120-130, max 155 - speech exercise at home - follow up as needed       At the end of the visit I did mention whether she would want to do complete physical exam and preventive care. She states that she does not want to do any preventive care. She states that she only wants to treat these current problems and does not want to do any further evaluation.   Past Medical History  Diagnosis Date  . Hypertension   . Aphasia     slight aphasia  . GERD (gastroesophageal reflux disease)   . Peripheral vascular disease (HCC)     ? in arm lft  . Vertebral artery aneurysm (HCC)     left VA aneursym by 07/2014 MRA (further eval by Dr. Conchita Paris following L CEA surgery)  . Allergy   . Anxiety   . Depression   .  Hyperlipidemia   . Stroke (HCC)     8/15 earlier in month     Home Meds: Outpatient Prescriptions Prior to Visit  Medication Sig Dispense Refill  . aspirin EC 81 MG EC tablet Take 1 tablet (81 mg total) by mouth daily. 30 tablet 0  . loratadine (CLARITIN) 10 MG tablet Take 10 mg by mouth daily as needed for allergies.    Marland Kitchen. amLODipine (NORVASC) 10 MG tablet Take 1 tablet (10 mg total) by mouth daily. 90 tablet 0  . atorvastatin (LIPITOR) 80 MG tablet TAKE 1 TABLET BY MOUTH DAILY AT 6:00PM 90 tablet 3  . valsartan (DIOVAN) 80 MG tablet Take 1 tablet (80 mg total) by mouth daily. 90 tablet 0  . ibuprofen (ADVIL,MOTRIN) 200 MG tablet Take 200 mg by mouth every 12 (twelve) hours as needed for moderate pain.      No facility-administered medications prior to visit.    Allergies:  Allergies  Allergen Reactions  . Codeine Other (See  Comments)    Makes patient incoherent and involuntarily perform acts that she does not remember.   . Erythromycin Itching and Rash    Hallucinations  . Penicillins Itching and Rash    Hallucinations   . Eggs Or Egg-Derived Products     Per pt "severely allergic" gets really sick  . Lisinopril Cough  . Ppd [Tuberculin Purified Protein Derivative] Hives  . Dopamine Hcl Nausea Only and Rash    Social History   Social History  . Marital Status: Divorced    Spouse Name: N/A  . Number of Children: 1  . Years of Education: college   Occupational History  . retired    Social History Main Topics  . Smoking status: Former Smoker -- 0.25 packs/day for 47 years    Types: Cigarettes    Quit date: 07/19/2014  . Smokeless tobacco: Never Used  . Alcohol Use: No  . Drug Use: No  . Sexual Activity: Not Currently   Other Topics Concern  . Not on file   Social History Narrative   Patient is divorced with one child.   Patient is right handed.   Patient has college education.   Patient drinks 2 cups daily.    Family History  Problem Relation Age of Onset  . Heart failure Mother   . Heart disease Mother   . Kidney disease Mother   . Diabetes Mother   . Hypertension Maternal Grandfather   . Stroke Maternal Grandfather   . Heart attack Neg Hx   . Diabetes Father      Review of Systems:  See HPI for pertinent ROS. All other ROS negative.    Physical Exam: Blood pressure 142/86, pulse 80, temperature 97.4 F (36.3 C), temperature source Oral, resp. rate 18, height 5' (1.524 m), weight 163 lb (73.936 kg)., Body mass index is 31.83 kg/(m^2). General: WNWD WF. Appears in no acute distress. Neck: Supple. No thyromegaly. No lymphadenopathy. Left CarotidEndoArterectomy Scar. Lungs: Clear bilaterally to auscultation without wheezes, rales, or rhonchi. Breathing is unlabored. Heart: RRR with S1 S2. No murmurs, rubs, or gallops. Abdomen: Soft, non-tender, non-distended with normoactive  bowel sounds. No hepatomegaly. No rebound/guarding. No obvious abdominal masses. Musculoskeletal:  Strength and tone normal for age. Extremities/Skin: Warm and dry.  No LE edema.  Neuro: Alert and oriented X 3. Moves all extremities spontaneously. Gait is normal. CNII-XII grossly in tact. Psych:  Responds to questions appropriately with a normal affect.     ASSESSMENT AND PLAN:  65 y.o. year old female with  1. Essential hypertension Reviewed BP log and BP readings look good. Continue current medications. Check lab to monitor. - COMPLETE METABOLIC PANEL WITH GFR - amLODipine (NORVASC) 10 MG tablet; Take 1 tablet (10 mg total) by mouth daily.  Dispense: 90 tablet; Refill: 2 - valsartan (DIOVAN) 80 MG tablet; Take 1 tablet (80 mg total) by mouth daily.  Dispense: 90 tablet; Refill: 2  2. H/O Ischemic stroke (HCC) See HPI for details.   3. Carotid stenosis, left H/O Left carotid endarterectomy.  4. HLD (hyperlipidemia) Goal LDL 70. Currently on Lipitor 80 mg. Check lab to monitor. - COMPLETE METABOLIC PANEL WITH GFR - Lipid panel - atorvastatin (LIPITOR) 80 MG tablet; TAKE 1 TABLET BY MOUTH DAILY AT 6:00PM  Dispense: 90 tablet; Refill: 2  5. Aneurysm, cerebral, nonruptured See history of present illness for detail. She had evaluation by Dr.Nundkumar--decided no surgery needed  6. Anxiety She has significant anxiety. Even during her initial visit with me 03/12/16 she demonstrates significant anxiety when discussing her PTSD and also when discussing her blood pressure.  7. Depression This is stable and controlled.  At the end of the visit I did mention whether she would want to do complete physical exam and preventive care. She states that she does not want to do any preventive care. She states that she only wants to treat these current problems and does not want to do any further evaluation.  Signed, [address] [place], [place], [facility] 03/12/2016 1:41 PM

## 2016-03-16 LAB — HEMOGLOBIN A1C

## 2016-03-17 ENCOUNTER — Other Ambulatory Visit: Payer: Self-pay | Admitting: Family Medicine

## 2016-03-17 DIAGNOSIS — R7301 Impaired fasting glucose: Secondary | ICD-10-CM

## 2016-03-18 ENCOUNTER — Other Ambulatory Visit: Payer: BLUE CROSS/BLUE SHIELD

## 2016-03-18 DIAGNOSIS — R7301 Impaired fasting glucose: Secondary | ICD-10-CM

## 2016-03-18 LAB — HEMOGLOBIN A1C
Hgb A1c MFr Bld: 6.6 % — ABNORMAL HIGH (ref <5.7)
MEAN PLASMA GLUCOSE: 143 mg/dL

## 2016-03-23 ENCOUNTER — Ambulatory Visit (INDEPENDENT_AMBULATORY_CARE_PROVIDER_SITE_OTHER): Payer: BLUE CROSS/BLUE SHIELD | Admitting: Physician Assistant

## 2016-03-23 ENCOUNTER — Encounter: Payer: Self-pay | Admitting: Physician Assistant

## 2016-03-23 VITALS — BP 142/86 | HR 80 | Temp 97.6°F | Resp 18 | Wt 164.0 lb

## 2016-03-23 DIAGNOSIS — R739 Hyperglycemia, unspecified: Secondary | ICD-10-CM | POA: Diagnosis not present

## 2016-03-23 NOTE — Progress Notes (Signed)
Patient ID: Diana Wells MRN: [id number], DOB: 06/08/1951, 65 y.o. Date of Encounter: @DATE @  Chief Complaint:  Chief Complaint  Patient presents with  . check up after labs    HPI: 65 y.o. year old white female  presents with above.   03/12/2016 OV: She states that she was [facility] for the past 1-1/2 years. Is reestablishing primary care here at our office. We are close for her-location.  She also reports that she just saw Neurology on Friday and "He released her and said to f/u with PCP" She also says he said "didn't want my BP too low and for me to check BP readings to review with PCP"  She does bring in blood pressure log.  This shows the following readings: Saturday--- 141/73, 145/74 Sunday 127/72, 136/68 Monday 136/72, 137/71, 110/60, 124/77 Tuesday 137/76, 128/66, 122/66 Wednesday 148/77, 134/83, 123/68  Later, on the day of this OV, I had time to review Neuro OV note from 03/06/16 And also reviewed her BP log. I called her on the phone and informed her that I had reviewed the neurologist note and that it states "recommend maintain blood pressure goal around 130/80". I discussed with her that it does not say anything further about any specific blood pressure readings and does not give extremely strict/tight parameters. I discussed that I think she can just check her blood pressure once a week. She responds, "Oh good, thank you so much. That was really scaring me".   She also comments that she has "PTSD--it is okay as long as she can exercise" and adds that she also has problems with claustrophobia. 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   Today I did review her last  office visit note with Neurology dated 03/06/16. That note does state that she can follow-up with them PRN. Their note includes:  " CT of the brain 07/17/2014 Hypodensities within the left basal ganglia and within the left occipital lobe which can be seen with age-indeterminate/likely subacute infarcts. There is mild expansion of the left basal ganglia with associated mass effect. As these represent different vascular distributions (MCA and PCA) recommend further evaluation with MRI/MRA.  MRI of the brain 07/18/2014 1. Multi focal acute ischemic left MCA territory ischemic infarcts involving the left basal ganglia and cortical and subcortical white matter of the left parietal and temporal lobes. No significant mass effect or evidence of hemorrhagic conversion. 2. Moderate chronic small vessel ischemic changes involving the supratentorial white matter and pons.  MRA of the brain 07/18/2014 1. 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 2. 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 3. Moderate multi focal atherosclerotic irregularity within the cavernous segments of the internal carotid arteries bilaterally and left vertebral artery.  CT Angio Neck 07/18/2014 1. Complex highly ulcerated plaque at the left carotid bifurcation. Short-segment proximal left ICA stenosis of up to 70 -75%. See sagittal image 134 of series 404. 2. No other hemodynamically significant stenosis in the neck. Great vessel origin and right carotid bifurcation plaque. 3. Dominant dolichoectatic  left vertebral artery. 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 4. Non dominant right vertebral artery with soft and calcified plaque intracranially resulting in high-grade stenosis just proximal to the right PICA origin, and then right vertebral artery occlusion distal to the  PICA.  Carotid Doppler Bilateral: Vertebral artery flow is antegrade. Right = 1-39% ICA stenosis. Left = 60-79% ICA stenosis.  2D Echocardiogram EF 55-60% with no source of embolus.  CXR 07/18/2014 No edema or consolidation. Atherosclerotic change with areas of calcification in the aorta and left carotid artery.  EKG normal sinus rhythm, nonspecific ST and T waves changes  CUS 02/2015 - right < 40% and left patent.   Assessment: As you may recall, she is a 65 y.o. Caucasian female with PMH of HTN, smoking, and anxiety, was admitted on 07/17/2014 for left MCA stroke, found to have left ICA stenosis 80%. Put on ASA and plavix as well as lipitor. Had LICA CEA by Dr. Myra Gianotti on 08/06/14. Also followed with Dr. Conchita Paris for left VA fusiform aneurysm which does not need intervention at this time. She is on aspirin and Lipitor now. She complained of transient blurry vision after taking HCTZ, and also dry cough which likely to be side effect from lisinopril. So her HCTZ and lisinopril were stopped and changed to novasc and diovan, so far BP controlled well at home. Speech much improved. Repeat CUS 02/2015 normal. Doing well during the interval time.   Plan:  - continue ASA and lipitor for stroke prevention  - continue amlodipine and diovan for BP control and check BP at home and record - Follow up with your primary care physician for stroke risk factor modification. Recommend maintain blood pressure goal around 130/80, diabetes with hemoglobin A1c goal below 6.5% and lipids with LDL cholesterol goal below 70 mg/dL.  - continue abstain from smoking - continue to follow up with vascular surgery - target HR on exercise 120-130, max 155 - speech exercise at home - follow up as needed  No orders of the defined types were placed in this encounter.   Patient Instructions  - continue ASA and lipitor for stroke prevention  - continue amlodipine and diovan for BP control and check BP at home and  record - Follow up with your primary care physician for stroke risk factor modification. Recommend maintain blood pressure goal around 130/80, diabetes with hemoglobin A1c goal below 6.5% and lipids with LDL cholesterol goal below 70 mg/dL.  - continue abstain from smoking - continue to follow up with vascular surgery - target HR on exercise 120-130, max 155 - speech exercise at home - follow up as needed       At the end of the visit I did mention whether she would want to do complete physical exam and preventive care. She states that she does not want to do any preventive care. She states that she only wants to treat these current problems and does not want to do any further evaluation.  At that OV, obtained labs to monitor current medications.  Glucose was elevated at 138 so A1C was added. A1C came back at 6.6. When staff called her with lab results requested her come in for visit to discuss this. She presents today to discuss   OV---03/23/2016: Today I gave and reviewed low carbohydrate handout. She states that she has been eating a lot of fruit. Says that she some bread. Says that she does eat quite a bit of potatoes especially french fries.  Also does eat "snack foods ". Says that she doesn't drink any sodas and doesn't eat cookies cake or and does not drink milk.    Past Medical History  Diagnosis Date  . Hypertension   . Aphasia     slight aphasia  . GERD (gastroesophageal reflux disease)   . Peripheral vascular disease (HCC)     ? in arm lft  . Vertebral artery aneurysm (HCC)     left VA aneursym by 07/2014 MRA (further eval by Dr. Conchita Paris following L CEA surgery)  . Allergy   . Anxiety   . Depression   . Hyperlipidemia   . Stroke (HCC)     8/15 earlier in month     Home Meds: Outpatient Prescriptions Prior to Visit  Medication Sig Dispense Refill  . amLODipine (NORVASC) 10 MG tablet Take 1 tablet (10 mg total) by mouth daily. 90 tablet 2  . aspirin EC 81 MG  EC tablet Take 1 tablet (81 mg total) by mouth daily. 30 tablet 0  . atorvastatin (LIPITOR) 80 MG tablet TAKE 1 TABLET BY MOUTH DAILY AT 6:00PM 90 tablet 2  . loratadine (CLARITIN) 10 MG tablet Take 10 mg by mouth daily as needed for allergies.    . valsartan (DIOVAN) 80 MG tablet Take 1 tablet (80 mg total) by mouth daily. 90 tablet 2   No facility-administered medications prior to visit.    Allergies:  Allergies  Allergen Reactions  . Codeine Other (See Comments)    Makes patient incoherent and involuntarily perform acts that she does not remember.   . Erythromycin Itching and Rash    Hallucinations  . Penicillins Itching and Rash    Hallucinations   . Eggs Or Egg-Derived Products     Per pt "severely allergic" gets really sick  . Lisinopril Cough  . Ppd [Tuberculin Purified Protein Derivative] Hives  . Dopamine Hcl Nausea Only and Rash    Social History   Social History  . Marital Status: Divorced    Spouse Name: N/A  . Number of Children: 1  . Years of Education: college   Occupational History  . retired    Social History Main Topics  . Smoking status: Former Smoker -- 0.25 packs/day for 47 years    Types: Cigarettes    Quit date: 07/19/2014  . Smokeless tobacco: Never Used  . Alcohol Use: No  . Drug Use: No  . Sexual Activity: Not Currently   Other Topics Concern  . Not on file   Social History Narrative   Patient is divorced with one child.   Patient is right handed.   Patient has college education.   Patient drinks 2 cups daily.    Family History  Problem Relation Age of Onset  . Heart failure Mother   . Heart disease Mother   . Kidney disease Mother   . Diabetes Mother   . Hypertension Maternal Grandfather   . Stroke Maternal Grandfather   . Heart attack Neg Hx   . Diabetes Father      Review of Systems:  See HPI for pertinent ROS. All other ROS negative.    Physical Exam: Blood pressure 142/86, pulse 80, temperature 97.6 F (36.4 C),  temperature source Oral, resp. rate 18, weight 164 lb (74.39 kg)., Body mass index is 32.03 kg/(m^2). General: WNWD WF. Appears in no acute distress. Neck: Supple. No thyromegaly. No lymphadenopathy. Left CarotidEndoArterectomy Scar. Lungs: Clear bilaterally to auscultation without wheezes, rales, or rhonchi. Breathing is unlabored.  Heart: RRR with S1 S2. No murmurs, rubs, or gallops. Abdomen: Soft, non-tender, non-distended with normoactive bowel sounds. No hepatomegaly. No rebound/guarding. No obvious abdominal masses. Musculoskeletal:  Strength and tone normal for age. Extremities/Skin: Warm and dry.  No LE edema.  Neuro: Alert and oriented X 3. Moves all extremities spontaneously. Gait is normal. CNII-XII grossly in tact. Psych:  Responds to questions appropriately with a normal affect.     ASSESSMENT AND PLAN:  65 y.o. year old female with   1. Hyperglycemia At OV 03/23/16 I gave and reviewed low carbohydrate handout. She is going to make some changes to her diet--- including decreasing the fruit and the snack foods and the french fries and potatoes. She will schedule follow-up office visit 3 months to recheck A1c.   THE FOLLOWING WAS NOT ADDRESSED AT OV 03/23/2016.  THE FOLLOWING IS COPIED FROM OV NOTE 03/12/2016:  1. Essential hypertension Reviewed BP log and BP readings look good. Continue current medications. Check lab to monitor. - COMPLETE METABOLIC PANEL WITH GFR - amLODipine (NORVASC) 10 MG tablet; Take 1 tablet (10 mg total) by mouth daily.  Dispense: 90 tablet; Refill: 2 - valsartan (DIOVAN) 80 MG tablet; Take 1 tablet (80 mg total) by mouth daily.  Dispense: 90 tablet; Refill: 2  2. H/O Ischemic stroke (HCC) See HPI for details.   3. Carotid stenosis, left H/O Left carotid endarterectomy.  4. HLD (hyperlipidemia) Goal LDL 70. Currently on Lipitor 80 mg. Check lab to monitor. - COMPLETE METABOLIC PANEL WITH GFR - Lipid panel - atorvastatin (LIPITOR) 80 MG  tablet; TAKE 1 TABLET BY MOUTH DAILY AT 6:00PM  Dispense: 90 tablet; Refill: 2  5. Aneurysm, cerebral, nonruptured See history of present illness for detail. She had evaluation by Dr.Nundkumar--decided no surgery needed  6. Anxiety She has significant anxiety. Even during her initial visit with me 03/12/16 she demonstrates significant anxiety when discussing her PTSD and also when discussing her blood pressure.  7. Depression This is stable and controlled.  At the end of the visit I did mention whether she would want to do complete physical exam and preventive care. She states that she does not want to do any preventive care. She states that she only wants to treat these current problems and does not want to do any further evaluation.  Signed, [address] [place], [place], BSFM 03/23/2016 10:10 AM

## 2016-05-18 ENCOUNTER — Encounter: Payer: Self-pay | Admitting: Cardiology

## 2016-06-24 ENCOUNTER — Encounter: Payer: Self-pay | Admitting: Family Medicine

## 2016-06-24 ENCOUNTER — Encounter: Payer: Self-pay | Admitting: Physician Assistant

## 2016-06-24 ENCOUNTER — Ambulatory Visit (INDEPENDENT_AMBULATORY_CARE_PROVIDER_SITE_OTHER): Payer: BLUE CROSS/BLUE SHIELD | Admitting: Physician Assistant

## 2016-06-24 VITALS — BP 160/90 | HR 84 | Temp 98.0°F | Resp 18 | Wt 160.0 lb

## 2016-06-24 DIAGNOSIS — R739 Hyperglycemia, unspecified: Secondary | ICD-10-CM | POA: Diagnosis not present

## 2016-06-24 DIAGNOSIS — F419 Anxiety disorder, unspecified: Secondary | ICD-10-CM | POA: Diagnosis not present

## 2016-06-24 DIAGNOSIS — F329 Major depressive disorder, single episode, unspecified: Secondary | ICD-10-CM | POA: Diagnosis not present

## 2016-06-24 DIAGNOSIS — I10 Essential (primary) hypertension: Secondary | ICD-10-CM | POA: Diagnosis not present

## 2016-06-24 DIAGNOSIS — I671 Cerebral aneurysm, nonruptured: Secondary | ICD-10-CM

## 2016-06-24 DIAGNOSIS — I639 Cerebral infarction, unspecified: Secondary | ICD-10-CM

## 2016-06-24 DIAGNOSIS — F32A Depression, unspecified: Secondary | ICD-10-CM

## 2016-06-24 DIAGNOSIS — I6522 Occlusion and stenosis of left carotid artery: Secondary | ICD-10-CM

## 2016-06-24 DIAGNOSIS — E785 Hyperlipidemia, unspecified: Secondary | ICD-10-CM

## 2016-06-24 LAB — HEMOGLOBIN A1C, FINGERSTICK: Hgb A1C (fingerstick): 6.4 % — ABNORMAL HIGH (ref <5.7)

## 2016-06-24 NOTE — Progress Notes (Signed)
Patient ID: Diana Wells MRN: [id number], DOB: 07-17-1951, 65 y.o. Date of Encounter: @  Chief Complaint:  Chief Complaint  Patient presents with  . 3 mth follow up    is fasting    HPI: 65 y.o. year old white female  presents with above.   03/12/2016 OV: She states that she was going to [facility] for her PCP for the past 1-1/2 years. Is reestablishing primary care here at our office. We are close for her-location.  She also reports that she just saw Neurology on Friday and "He released her and said to f/u with PCP" She also says he said "didn't want my BP too low and for me to check BP readings to review with PCP"  She does bring in blood pressure log.  This shows the following readings: Saturday--- 141/73, 145/74 Sunday 127/72, 136/68 Monday 136/72, 137/71, 110/60, 124/77 Tuesday 137/76, 128/66, 122/66 Wednesday 148/77, 134/83, 123/68  Later, on the day of this OV, I had time to review Neuro OV note from 03/06/16 And also reviewed her BP log. I called her on the phone and informed her that I had reviewed the neurologist note and that it states "recommend maintain blood pressure goal around 130/80". I discussed with her that it does not say anything further about any specific blood pressure readings and does not give extremely strict/tight parameters. I discussed that I think she can just check her blood pressure once a week. She responds, "Oh good, thank you so much. That was really scaring me".   She also comments that she has "PTSD--it is okay as long as she can exercise" and adds that she also has problems with claustrophobia. 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   Today I did  review her last office visit note with Neurology dated 03/06/16. That note does state that she can follow-up with them PRN. Their note includes:  " CT of the brain 07/17/2014 Hypodensities within the left basal ganglia and within the left occipital lobe which can be seen with age-indeterminate/likely subacute infarcts. There is mild expansion of the left basal ganglia with associated mass effect. As these represent different vascular distributions (MCA and PCA) recommend further evaluation with MRI/MRA.  MRI of the brain 07/18/2014 1. Multi focal acute ischemic left MCA territory ischemic infarcts involving the left basal ganglia and cortical and subcortical white matter of the left parietal and temporal lobes. No significant mass effect or evidence of hemorrhagic conversion. 2. Moderate chronic small vessel ischemic changes involving the supratentorial white matter and pons.  MRA of the brain 07/18/2014 1. 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 2. 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 3. Moderate multi focal atherosclerotic irregularity within the cavernous segments of the internal carotid arteries bilaterally and left vertebral artery.  CT Angio Neck 07/18/2014 1. Complex highly ulcerated plaque at the left carotid bifurcation. Short-segment proximal left ICA stenosis of up to 70 -75%. See sagittal image 134 of series 404. 2. No other hemodynamically significant stenosis in the neck. Great vessel origin and right carotid  bifurcation plaque. 3. Dominant dolichoectatic left vertebral artery. 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 4. Non dominant right vertebral artery with soft and calcified plaque intracranially resulting in high-grade stenosis just proximal to the right PICA origin, and then right vertebral artery occlusion  distal to the PICA.  Carotid Doppler Bilateral: Vertebral artery flow is antegrade. Right = 1-39% ICA stenosis. Left = 60-79% ICA stenosis.  2D Echocardiogram EF 55-60% with no source of embolus.  CXR 07/18/2014 No edema or consolidation. Atherosclerotic change with areas of calcification in the aorta and left carotid artery.  EKG normal sinus rhythm, nonspecific ST and T waves changes  CUS 02/2015 - right < 40% and left patent.   Assessment: As you may recall, she is a 65 y.o. Caucasian female with PMH of HTN, smoking, and anxiety, was admitted on 07/17/2014 for left MCA stroke, found to have left ICA stenosis 80%. Put on ASA and plavix as well as lipitor. Had LICA CEA by Dr. Myra Gianotti on 08/06/14. Also followed with Dr. Conchita Paris for left VA fusiform aneurysm which does not need intervention at this time. She is on aspirin and Lipitor now. She complained of transient blurry vision after taking HCTZ, and also dry cough which likely to be side effect from lisinopril. So her HCTZ and lisinopril were stopped and changed to novasc and diovan, so far BP controlled well at home. Speech much improved. Repeat CUS 02/2015 normal. Doing well during the interval time.   Plan:  - continue ASA and lipitor for stroke prevention  - continue amlodipine and diovan for BP control and check BP at home and record - Follow up with your primary care physician for stroke risk factor modification. Recommend maintain blood pressure goal around 130/80, diabetes with hemoglobin A1c goal below 6.5% and lipids with LDL cholesterol goal below 70 mg/dL.  - continue abstain from smoking - continue to follow up with vascular surgery - target HR on exercise 120-130, max 155 - speech exercise at home - follow up as needed  No orders of the defined types were placed in this encounter.   Patient Instructions  - continue ASA and lipitor for stroke prevention  - continue amlodipine and diovan for BP control and check BP at  home and record - Follow up with your primary care physician for stroke risk factor modification. Recommend maintain blood pressure goal around 130/80, diabetes with hemoglobin A1c goal below 6.5% and lipids with LDL cholesterol goal below 70 mg/dL.  - continue abstain from smoking - continue to follow up with vascular surgery - target HR on exercise 120-130, max 155 - speech exercise at home - follow up as needed       At the end of the visit I did mention whether she would want to do complete physical exam and preventive care. She states that she does not want to do any preventive care. She states that she only wants to treat these current problems and does not want to do any further evaluation.  At that OV, obtained labs to monitor current medications.  Glucose was elevated at 138 so A1C was added. A1C came back at 6.6. When staff called her with lab results requested her come in for visit to discuss this. She presents today to discuss   OV---03/23/2016: Today I gave and reviewed low carbohydrate handout. She states that she has been eating a lot of fruit. Says that she some bread. Says that she does eat quite a bit  of potatoes especially french fries. Also does eat "snack foods ". Says that she doesn't drink any sodas and doesn't eat cookies cake or and does not drink milk.  OV 06/24/2016: When I entered into the room she is excited and says that she has lost 4 pounds!! She also says that she checked her blood pressure at home this morning right before office visit and got 121/72----says that we get higher readings here because she has white coat syndrome. Then she talks about the fact of how the stroke has affected her brain. Says often she cannot get out the words correctly.  Says also she will flip numbers and say them in reverse order----" you can say 6.3 and I will then say 3.6" Says that she has a real problem with that unless she can see things in writing. Says that she also  has difficulty processing information and even when she can see it in writing still has to keep looking at it and has difficulty processing information. Says this has happened regarding the low carbohydrate information I reviewed and gave her at last office visit. Also says that after her doctor's appointments, her daughter will ask her how the visit went and she can't think of anything to say at all. Says that her daughter lives in [place] and works as a Chief Executive Officer. Also says that she has a friend who is an Charity fundraiser. At today's visit I have printed her last set of lab results and her last office visit and she is going to take these home and schedule a time for her daughter and her RN friend and herself to all get together and review this information and help her understand and process this and help her in regards to shopping and preparing foods.  Past Medical History  Diagnosis Date  . Hypertension   . Aphasia     slight aphasia  . GERD (gastroesophageal reflux disease)   . Peripheral vascular disease (HCC)     ? in arm lft  . Vertebral artery aneurysm (HCC)     left VA aneursym by 07/2014 MRA (further eval by Dr. Conchita Paris following L CEA surgery)  . Allergy   . Anxiety   . Depression   . Hyperlipidemia   . Stroke (HCC)     8/15 earlier in month     Home Meds: Outpatient Prescriptions Prior to Visit  Medication Sig Dispense Refill  . amLODipine (NORVASC) 10 MG tablet Take 1 tablet (10 mg total) by mouth daily. 90 tablet 2  . aspirin EC 81 MG EC tablet Take 1 tablet (81 mg total) by mouth daily. 30 tablet 0  . atorvastatin (LIPITOR) 80 MG tablet TAKE 1 TABLET BY MOUTH DAILY AT 6:00PM 90 tablet 2  . loratadine (CLARITIN) 10 MG tablet Take 10 mg by mouth daily as needed for allergies.    . valsartan (DIOVAN) 80 MG tablet Take 1 tablet (80 mg total) by mouth daily. 90 tablet 2   No facility-administered medications prior to visit.    Allergies:  Allergies  Allergen Reactions  .  Codeine Other (See Comments)    Makes patient incoherent and involuntarily perform acts that she does not remember.   . Erythromycin Itching and Rash    Hallucinations  . Penicillins Itching and Rash    Hallucinations   . Eggs Or Egg-Derived Products     Per pt "severely allergic" gets really sick  . Lisinopril Cough  . Ppd [Tuberculin Purified Protein Derivative] Hives  .  Dopamine Hcl Nausea Only and Rash    Social History   Social History  . Marital Status: Divorced    Spouse Name: N/A  . Number of Children: 1  . Years of Education: college   Occupational History  . retired    Social History Main Topics  . Smoking status: Former Smoker -- 0.25 packs/day for 47 years    Types: Cigarettes    Quit date: 07/19/2014  . Smokeless tobacco: Never Used  . Alcohol Use: No  . Drug Use: No  . Sexual Activity: Not Currently   Other Topics Concern  . Not on file   Social History Narrative   Patient is divorced with one child.   Patient is right handed.   Patient has college education.   Patient drinks 2 cups daily.    Family History  Problem Relation Age of Onset  . Heart failure Mother   . Heart disease Mother   . Kidney disease Mother   . Diabetes Mother   . Hypertension Maternal Grandfather   . Stroke Maternal Grandfather   . Heart attack Neg Hx   . Diabetes Father      Review of Systems:  See HPI for pertinent ROS. All other ROS negative.    Physical Exam: Blood pressure 160/90, pulse 84, temperature 98 F (36.7 C), temperature source Oral, resp. rate 18, weight 160 lb (72.576 kg)., Body mass index is 31.25 kg/(m^2). General: WNWD WF. Appears in no acute distress. Neck: Supple. No thyromegaly. No lymphadenopathy. Left CarotidEndoArterectomy Scar. Lungs: Clear bilaterally to auscultation without wheezes, rales, or rhonchi. Breathing is unlabored. Heart: RRR with S1 S2. No murmurs, rubs, or gallops. Abdomen: Soft, non-tender, non-distended with normoactive bowel  sounds. No hepatomegaly. No rebound/guarding. No obvious abdominal masses. Musculoskeletal:  Strength and tone normal for age. Extremities/Skin: Warm and dry.  No LE edema.  Neuro: Alert and oriented X 3. Moves all extremities spontaneously. Gait is normal. CNII-XII grossly in tact. Psych:  Responds to questions appropriately with a normal affect.     ASSESSMENT AND PLAN:  65 y.o. year old female with   Hyperglycemia At OV 03/23/16 I gave and reviewed low carbohydrate handout.  Recheck A1c now  Essential hypertension Reviewed BP log and BP readings look good. Continue current medications. Check lab to monitor. - COMPLETE METABOLIC PANEL WITH GFR - amLODipine (NORVASC) 10 MG tablet; Take 1 tablet (10 mg total) by mouth daily.  Dispense: 90 tablet; Refill: 2 - valsartan (DIOVAN) 80 MG tablet; Take 1 tablet (80 mg total) by mouth daily.  Dispense: 90 tablet; Refill: 2  H/O Ischemic stroke (HCC) See HPI for details.   Carotid stenosis, left H/O Left carotid endarterectomy.  HLD (hyperlipidemia) Goal LDL 70. Currently on Lipitor 80 mg. Check lab to monitor. - COMPLETE METABOLIC PANEL WITH GFR - Lipid panel - atorvastatin (LIPITOR) 80 MG tablet; TAKE 1 TABLET BY MOUTH DAILY AT 6:00PM  Dispense: 90 tablet; Refill: 2  Aneurysm, cerebral, nonruptured See history of present illness for detail. She had evaluation by Dr.Nundkumar--decided no surgery needed  Anxiety She has significant anxiety. Even during her initial visit with me 03/12/16 she demonstrates significant anxiety when discussing her PTSD and also when discussing her blood pressure.  Depression This is stable and controlled.  At past visit I did mention whether she would want to do complete physical exam and preventive care. She states that she does not want to do any preventive care. She states that she only wants to treat  these current problems and does not want to do any further evaluation.  F/U OV 3 months, sooner  if needed  Signed, [facility] Desert Surgery CenterMary Beth ToppenishDixon, [place]PA, [facility] 06/24/2016 8:16 AM

## 2016-08-11 ENCOUNTER — Encounter: Payer: Self-pay | Admitting: Family

## 2016-08-19 ENCOUNTER — Ambulatory Visit (HOSPITAL_COMMUNITY)
Admission: RE | Admit: 2016-08-19 | Discharge: 2016-08-19 | Disposition: A | Discharge status: Home / Self Care | Payer: Commercial Managed Care - HMO | Source: Ambulatory Visit | Attending: Family | Admitting: Family

## 2016-08-19 ENCOUNTER — Telehealth: Payer: Self-pay | Admitting: Family Medicine

## 2016-08-19 ENCOUNTER — Ambulatory Visit (INDEPENDENT_AMBULATORY_CARE_PROVIDER_SITE_OTHER): Payer: Commercial Managed Care - HMO | Admitting: Family

## 2016-08-19 ENCOUNTER — Encounter: Payer: Self-pay | Admitting: Family

## 2016-08-19 VITALS — BP 128/74 | HR 82 | Temp 97.3°F | Resp 14 | Ht 60.0 in | Wt 156.0 lb

## 2016-08-19 DIAGNOSIS — I779 Disorder of arteries and arterioles, unspecified: Secondary | ICD-10-CM | POA: Diagnosis not present

## 2016-08-19 DIAGNOSIS — I6522 Occlusion and stenosis of left carotid artery: Secondary | ICD-10-CM | POA: Diagnosis not present

## 2016-08-19 DIAGNOSIS — I739 Peripheral vascular disease, unspecified: Secondary | ICD-10-CM

## 2016-08-19 DIAGNOSIS — Z87891 Personal history of nicotine dependence: Secondary | ICD-10-CM | POA: Diagnosis not present

## 2016-08-19 DIAGNOSIS — Z9889 Other specified postprocedural states: Secondary | ICD-10-CM

## 2016-08-19 NOTE — Telephone Encounter (Signed)
Pt at [facility] for vascular ultrasounds and to see provider.  Ultrasound do not require PA.  Auth to see provider Dr Myra GianottiBrabham approved [id number] for 6 visits from 08/19/16 - 02/15/17 Dx [id number].

## 2016-08-19 NOTE — Progress Notes (Signed)
Chief Complaint: Follow up Extracranial Carotid Artery Stenosis   History of Present Illness  Diana Wells is a 65 y.o. female patient of Dr. Linnell FullingBraham who is status post left carotid endarterectomy on 08/06/2014.  This was done for symptomatic left carotid stenosis.  Prior to her operation, she had suffered a left brain stroke which left her with symptoms of expressive aphasia.  She states that her symptoms have improved significantly. She denies any subsequent stroke or TIA.    She switched to a plant based diet in August 2017, states she has intentionally lost 10 pounds, and states her serum glucose had decreased.   She denies claudication sx's with walking. She exercises 3-4x/week, walks the other days of the week.   Pt Diabetic: yes, 6.4 A1C on 06/24/16 (review of records) Pt smoker: former smoker, quit in 2015, smoked x 47 years   Pt meds include: Statin : yes ASA: yes Other anticoagulants/antiplatelets: no    Past Medical History:  Diagnosis Date  . Allergy   . Anxiety   . Aphasia    slight aphasia  . Depression   . GERD (gastroesophageal reflux disease)   . Hyperlipidemia   . Hypertension   . Peripheral vascular disease (HCC)    ? in arm lft  . Stroke (HCC)    8/15 earlier in month  . Vertebral artery aneurysm (HCC)    left VA aneursym by 07/2014 MRA (further eval by Dr. Conchita ParisNundkumar following L CEA surgery)    Social History Social History  Substance Use Topics  . Smoking status: Former Smoker    Packs/day: 0.25    Years: 47.00    Types: Cigarettes    Quit date: 07/19/2014  . Smokeless tobacco: Never Used  . Alcohol use No    Family History Family History  Problem Relation Age of Onset  . Heart failure Mother   . Heart disease Mother   . Kidney disease Mother   . Diabetes Mother   . Hypertension Maternal Grandfather   . Stroke Maternal Grandfather   . Heart attack Neg Hx   . Diabetes Father     Surgical History Past Surgical History:  Procedure  Laterality Date  . ENDARTERECTOMY Left 08/03/2014   Procedure: LEFT CAROTID ENDARTERECTOMY  WITH PATCH ANGIOPLASTY;  Surgeon: Nada LibmanVance W Brabham, MD;  [facility];  Service: Vascular;  Laterality: Left;    Allergies  Allergen Reactions  . Codeine Other (See Comments)    Makes patient incoherent and involuntarily perform acts that she does not remember.   . Erythromycin Itching and Rash    Hallucinations  . Penicillins Itching and Rash    Hallucinations   . Eggs Or Egg-Derived Products     Per pt "severely allergic" gets really sick  . Lisinopril Cough  . Ppd [Tuberculin Purified Protein Derivative] Hives  . Dopamine Hcl Nausea Only and Rash    Current Outpatient Prescriptions  Medication Sig Dispense Refill  . amLODipine (NORVASC) 10 MG tablet Take 1 tablet (10 mg total) by mouth daily. 90 tablet 2  . aspirin EC 81 MG EC tablet Take 1 tablet (81 mg total) by mouth daily. 30 tablet 0  . atorvastatin (LIPITOR) 80 MG tablet TAKE 1 TABLET BY MOUTH DAILY AT 6:00PM 90 tablet 2  . loratadine (CLARITIN) 10 MG tablet Take 10 mg by mouth daily as needed for allergies.    . valsartan (DIOVAN) 80 MG tablet Take 1 tablet (80 mg total) by mouth daily. 90 tablet 2  No current facility-administered medications for this visit.     Review of Systems : See HPI for pertinent positives and negatives.  Physical Examination  Vitals:   08/19/16 1156  BP: 128/74  Pulse: 82  Resp: 14  Temp: 97.3 F (36.3 C)  TempSrc: Oral  SpO2: 98%  Weight: 156 lb (70.8 kg)  Height: 5' (1.524 m)   Body mass index is 30.47 kg/m.  General: WDWN female in NAD GAIT: normal Eyes: PERRLA Pulmonary:  Respirations are non-labored, CTAB, good air movement Cardiac: regular rhythm, no detected murmur.  VASCULAR EXAM Carotid Bruits Right Left   Negative Negative    Aorta is not palpable. Radial pulses are 2+ palpable and equal.                                                                                                                             LE Pulses Right Left       POPLITEAL  not palpable   not palpable       POSTERIOR TIBIAL   palpable    palpable        DORSALIS PEDIS      ANTERIOR TIBIAL  palpable   palpable     Gastrointestinal: soft, nontender, BS WNL, no r/g, no palpable masses.  Musculoskeletal: no muscle atrophy/wasting. M/S 5/5 throughout, extremities without ischemic changes.  Neurologic: A&O X 3; Appropriate Affect, Speech is normal CN 2-12 intact, pain and light touch intact in extremities, Motor exam as listed above.     Assessment: Diana Wells is a 65 y.o. female who is status post left carotid endarterectomy on 08/06/2014.  This was done for symptomatic left carotid stenosis.  Prior to her operation, she had suffered a left brain stroke which left her with symptoms of expressive aphasia.  She states that her symptoms have improved significantly. She denies any subsequent stroke or TIA.   She is diligent about lifestyle modification.    DATA Carotid duplex suggests <40% right ICA stenosis and no evidence of restenosis at the left CEA site. No significant change compared to the exam of 02/25/15.    Plan: Follow-up in 1 year with Carotid Duplex scan.   I discussed in depth with the patient the nature of atherosclerosis, and emphasized the importance of maximal medical management including strict control of blood pressure, blood glucose, and lipid levels, obtaining regular exercise, and continued cessation of smoking.  The patient is aware that without maximal medical management the underlying atherosclerotic disease process will progress, limiting the benefit of any interventions. The patient was given information about stroke prevention and what symptoms should prompt the patient to seek immediate medical care. Thank you for allowing [place] to participate in this patient's care.  Charisse March, RN, MSN, FNP-C [facility] Office:  [phone]  Clinic Physician: Darrick Penna on call  08/19/16 12:13 PM

## 2016-08-19 NOTE — Patient Instructions (Signed)
Stroke Prevention Some medical conditions and behaviors are associated with an increased chance of having a stroke. You may prevent a stroke by making healthy choices and managing medical conditions. HOW CAN I REDUCE MY RISK OF HAVING A STROKE?   Stay physically active. Get at least 30 minutes of activity on most or all days.  Do not smoke. It may also be helpful to avoid exposure to secondhand smoke.  Limit alcohol use. Moderate alcohol use is considered to be:  No more than 2 drinks per day for men.  No more than 1 drink per day for nonpregnant women.  Eat healthy foods. This involves:  Eating 5 or more servings of fruits and vegetables a day.  Making dietary changes that address high blood pressure (hypertension), high cholesterol, diabetes, or obesity.  Manage your cholesterol levels.  Making food choices that are high in fiber and low in saturated fat, trans fat, and cholesterol may control cholesterol levels.  Take any prescribed medicines to control cholesterol as directed by your health care provider.  Manage your diabetes.  Controlling your carbohydrate and sugar intake is recommended to manage diabetes.  Take any prescribed medicines to control diabetes as directed by your health care provider.  Control your hypertension.  Making food choices that are low in salt (sodium), saturated fat, trans fat, and cholesterol is recommended to manage hypertension.  Ask your health care provider if you need treatment to lower your blood pressure. Take any prescribed medicines to control hypertension as directed by your health care provider.  If you are 18-39 years of age, have your blood pressure checked every 3-5 years. If you are 40 years of age or older, have your blood pressure checked every year.  Maintain a healthy weight.  Reducing calorie intake and making food choices that are low in sodium, saturated fat, trans fat, and cholesterol are recommended to manage  weight.  Stop drug abuse.  Avoid taking birth control pills.  Talk to your health care provider about the risks of taking birth control pills if you are over 35 years old, smoke, get migraines, or have ever had a blood clot.  Get evaluated for sleep disorders (sleep apnea).  Talk to your health care provider about getting a sleep evaluation if you snore a lot or have excessive sleepiness.  Take medicines only as directed by your health care provider.  For some people, aspirin or blood thinners (anticoagulants) are helpful in reducing the risk of forming abnormal blood clots that can lead to stroke. If you have the irregular heart rhythm of atrial fibrillation, you should be on a blood thinner unless there is a good reason you cannot take them.  Understand all your medicine instructions.  Make sure that other conditions (such as anemia or atherosclerosis) are addressed. SEEK IMMEDIATE MEDICAL CARE IF:   You have sudden weakness or numbness of the face, arm, or leg, especially on one side of the body.  Your face or eyelid droops to one side.  You have sudden confusion.  You have trouble speaking (aphasia) or understanding.  You have sudden trouble seeing in one or both eyes.  You have sudden trouble walking.  You have dizziness.  You have a loss of balance or coordination.  You have a sudden, severe headache with no known cause.  You have new chest pain or an irregular heartbeat. Any of these symptoms may represent a serious problem that is an emergency. Do not wait to see if the symptoms will   go away. Get medical help at once. Call your local emergency services (911 in [place] Do not drive yourself to the hospital.   This information is not intended to replace advice given to you by your health care provider. Make sure you discuss any questions you have with your health care provider.   Document Released: 01/07/2005 Document Revised: 12/21/2014 Document Reviewed:  06/02/2013 Elsevier Interactive Patient Education 2016 [employer]  

## 2016-10-01 ENCOUNTER — Ambulatory Visit (INDEPENDENT_AMBULATORY_CARE_PROVIDER_SITE_OTHER): Payer: BLUE CROSS/BLUE SHIELD | Admitting: Physician Assistant

## 2016-10-01 ENCOUNTER — Encounter: Payer: Self-pay | Admitting: Physician Assistant

## 2016-10-01 VITALS — BP 126/70 | HR 91 | Temp 97.8°F | Resp 16 | Wt 154.0 lb

## 2016-10-01 DIAGNOSIS — F419 Anxiety disorder, unspecified: Secondary | ICD-10-CM | POA: Diagnosis not present

## 2016-10-01 DIAGNOSIS — F32A Depression, unspecified: Secondary | ICD-10-CM

## 2016-10-01 DIAGNOSIS — E785 Hyperlipidemia, unspecified: Secondary | ICD-10-CM

## 2016-10-01 DIAGNOSIS — I6529 Occlusion and stenosis of unspecified carotid artery: Secondary | ICD-10-CM | POA: Diagnosis not present

## 2016-10-01 DIAGNOSIS — R739 Hyperglycemia, unspecified: Secondary | ICD-10-CM

## 2016-10-01 DIAGNOSIS — I639 Cerebral infarction, unspecified: Secondary | ICD-10-CM

## 2016-10-01 DIAGNOSIS — I10 Essential (primary) hypertension: Secondary | ICD-10-CM | POA: Diagnosis not present

## 2016-10-01 DIAGNOSIS — I671 Cerebral aneurysm, nonruptured: Secondary | ICD-10-CM | POA: Diagnosis not present

## 2016-10-01 DIAGNOSIS — F329 Major depressive disorder, single episode, unspecified: Secondary | ICD-10-CM

## 2016-10-01 LAB — COMPLETE METABOLIC PANEL WITH GFR
ALBUMIN: 4.7 g/dL (ref 3.6–5.1)
ALK PHOS: 114 U/L (ref 33–130)
ALT: 24 U/L (ref 6–29)
AST: 25 U/L (ref 10–35)
BUN: 10 mg/dL (ref 7–25)
CHLORIDE: 106 mmol/L (ref 98–110)
CO2: 24 mmol/L (ref 20–31)
Calcium: 10 mg/dL (ref 8.6–10.4)
Creat: 0.68 mg/dL (ref 0.50–0.99)
GFR, Est African American: >89 mL/min (ref >=60)
GLUCOSE: 119 mg/dL — AB (ref 70–99)
POTASSIUM: 5.6 mmol/L — AB (ref 3.5–5.3)
SODIUM: 144 mmol/L (ref 135–146)
Total Bilirubin: 0.6 mg/dL (ref 0.2–1.2)
Total Protein: 7.4 g/dL (ref 6.1–8.1)

## 2016-10-01 LAB — LIPID PANEL
CHOL/HDL RATIO: 3.3 ratio (ref <=5.0)
CHOLESTEROL: 132 mg/dL (ref 125–200)
HDL: 40 mg/dL — AB (ref >=46)
LDL Cholesterol: 75 mg/dL (ref <130)
Triglycerides: 84 mg/dL (ref <150)
VLDL: 17 mg/dL (ref <30)

## 2016-10-01 NOTE — Progress Notes (Signed)
Patient ID: Diana Wells MRN: [id number], DOB: Oct 28, 1951, 65 y.o. Date of Encounter: @DATE @  Chief Complaint:  Chief Complaint  Patient presents with  . Hypertension    f/u    HPI: 65 y.o. year old white female  presents with above.    She saw me as a new pt to Establish Care with our office 03/12/2016.  A that OV she reported that she just saw Neurology on Friday and "He released her and said to f/u with PCP" She was to f/u with PCP to keep BP controlled.   I reviewed Neuro OV note from 03/06/16 -- it stated "recommend maintain blood pressure goal around 130/80".  At prior visits she has relayed to me that she gets very worried about things--like making sure her BP is just right. But also has told me that since her stroke she has hard time keeping information correct and with being able to correctly express/ voice her thoughts.  She also has informed me that she has "PTSD--it is okay as long as she can exercise" and adds that she also has problems with claustrophobia. 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  Other pertinent information is from Neurology note dated 03/06/16. That note does state that she can follow-up with them PRN. Their note includes:  " CT of the brain 07/17/2014 Hypodensities within the left basal ganglia and within the left occipital lobe which can be seen with age-indeterminate/likely subacute infarcts. There is mild expansion of the left basal ganglia with associated mass effect. As these represent different vascular distributions (MCA and PCA) recommend further evaluation with MRI/MRA.  MRI of the brain 07/18/2014 1. Multi focal acute ischemic left MCA territory ischemic infarcts involving the  left basal ganglia and cortical and subcortical white matter of the left parietal and temporal lobes. No significant mass effect or evidence of hemorrhagic conversion. 2. Moderate chronic small vessel ischemic changes involving the supratentorial white matter and pons.  MRA of the brain 07/18/2014 1. 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 2. 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 3. Moderate multi focal atherosclerotic irregularity within the cavernous segments of the internal carotid arteries bilaterally and left vertebral artery.  CT Angio Neck 07/18/2014 1. Complex highly ulcerated plaque at the left carotid bifurcation. Short-segment proximal left ICA stenosis of up to 70 -75%. See sagittal image 134 of series 404. 2. No other hemodynamically significant stenosis in the neck. Great vessel origin and right carotid bifurcation plaque. 3. Dominant dolichoectatic left vertebral artery. 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 4. Non dominant right vertebral artery with soft and calcified plaque intracranially resulting in high-grade stenosis just proximal to the right PICA origin, and then right vertebral artery occlusion distal to the PICA.  Carotid Doppler Bilateral: Vertebral artery flow is antegrade. Right = 1-39% ICA stenosis. Left = 60-79% ICA stenosis.  2D Echocardiogram EF 55-60% with no source of embolus.  CXR 07/18/2014 No edema or consolidation. Atherosclerotic change with areas of calcification in the aorta and left carotid artery.  EKG normal sinus  rhythm, nonspecific ST and T waves changes  CUS 02/2015 - right < 40% and left patent.   Assessment: As you may recall, she is a 65 y.o. Caucasian female with PMH of HTN, smoking, and anxiety, was admitted on 07/17/2014 for left MCA stroke, found to have left ICA  stenosis 80%. Put on ASA and plavix as well as lipitor. Had LICA CEA by Dr. Myra GianottiBrabham on 08/06/14. Also followed with Dr. Conchita ParisNundkumar for left VA fusiform aneurysm which does not need intervention at this time. She is on aspirin and Lipitor now. She complained of transient blurry vision after taking HCTZ, and also dry cough which likely to be side effect from lisinopril. So her HCTZ and lisinopril were stopped and changed to novasc and diovan, so far BP controlled well at home. Speech much improved. Repeat CUS 02/2015 normal. Doing well during the interval time.   Plan:  - continue ASA and lipitor for stroke prevention  - continue amlodipine and diovan for BP control and check BP at home and record - Follow up with your primary care physician for stroke risk factor modification. Recommend maintain blood pressure goal around 130/80, diabetes with hemoglobin A1c goal below 6.5% and lipids with LDL cholesterol goal below 70 mg/dL.  - continue abstain from smoking - continue to follow up with vascular surgery - target HR on exercise 120-130, max 155 - speech exercise at home - follow up as needed  No orders of the defined types were placed in this encounter.   Patient Instructions  - continue ASA and lipitor for stroke prevention  - continue amlodipine and diovan for BP control and check BP at home and record - Follow up with your primary care physician for stroke risk factor modification. Recommend maintain blood pressure goal around 130/80, diabetes with hemoglobin A1c goal below 6.5% and lipids with LDL cholesterol goal below 70 mg/dL.  - continue abstain from smoking - continue to follow up with vascular surgery - target HR on exercise 120-130, max 155 - speech exercise at home - follow up as needed       At prior visit I asked whether she would want to do complete physical exam and preventive care. She stated that she does not want to do any preventive care. She stated that she only  wants to treat these current problems and does not want to do any further evaluation.  At initial OV here, lab reveled high glucose. A1C was added and was slightly elevated.  Low Carbohydrate diet informations was then reviewed, given to her.   At OV 06/2016--- Then she taled about the fact of how the stroke has affected her brain. Said often she cannot get out the words correctly.  York SpanielSaid  also she will flip numbers and say them in reverse order----" you can say 6.3 and I will then say 3.6" Said that she has a real problem with that unless she can see things in writing. Said that she also has difficulty processing information and even when she can see it in writing still has to keep looking at it and has difficulty processing information. York SpanielSaid this has happened regarding the low carbohydrate information I reviewed and gave her at last office visit. Also said that after her doctor's appointments, her daughter will ask her how the visit went and she can't think of anything to say at all. York SpanielSaid  that her daughter lives in [place] and works as a Chief Executive Officerfitness trainer. Also said that she has a friend  who is an Charity fundraiser. At that visit I printed her last set of lab results and her last office visit and she was going to take these home and schedule a time for her daughter and her RN friend and herself to all get together and review this information and help her understand and process this and help her in regards to shopping and preparing foods.  10/01/2016: She has been following low Carb information. She has last more weight and is excited about this.  She is taking BP meds as directed. No lightheadedness or other adverse effects.  She is taking Lipitor as directed. No myalgias or other adverse effects.  She has no complaints or concerns today.   Past Medical History:  Diagnosis Date  . Allergy   . Anxiety   . Aphasia    slight aphasia  . Depression   . GERD (gastroesophageal reflux disease)   .  Hyperlipidemia   . Hypertension   . Peripheral vascular disease (HCC)    ? in arm lft  . Stroke (HCC)    8/15 earlier in month  . Vertebral artery aneurysm (HCC)    left VA aneursym by 07/2014 MRA (further eval by Dr. Conchita Paris following L CEA surgery)     Home Meds: Outpatient Medications Prior to Visit  Medication Sig Dispense Refill  . amLODipine (NORVASC) 10 MG tablet Take 1 tablet (10 mg total) by mouth daily. 90 tablet 2  . aspirin EC 81 MG EC tablet Take 1 tablet (81 mg total) by mouth daily. 30 tablet 0  . atorvastatin (LIPITOR) 80 MG tablet TAKE 1 TABLET BY MOUTH DAILY AT 6:00PM 90 tablet 2  . loratadine (CLARITIN) 10 MG tablet Take 10 mg by mouth daily as needed for allergies.    . valsartan (DIOVAN) 80 MG tablet Take 1 tablet (80 mg total) by mouth daily. 90 tablet 2   No facility-administered medications prior to visit.     Allergies:  Allergies  Allergen Reactions  . Codeine Other (See Comments)    Makes patient incoherent and involuntarily perform acts that she does not remember.   . Erythromycin Itching and Rash    Hallucinations  . Penicillins Itching and Rash    Hallucinations   . Eggs Or Egg-Derived Products     Per pt "severely allergic" gets really sick  . Lisinopril Cough  . Ppd [Tuberculin Purified Protein Derivative] Hives  . Dopamine Hcl Nausea Only and Rash    Social History   Social History  . Marital status: Divorced    Spouse name: N/A  . Number of children: 1  . Years of education: college   Occupational History  . retired    Social History Main Topics  . Smoking status: Former Smoker    Packs/day: 0.25    Years: 47.00    Types: Cigarettes    Quit date: 07/19/2014  . Smokeless tobacco: Never Used  . Alcohol use No  . Drug use: No  . Sexual activity: Not Currently   Other Topics Concern  . Not on file   Social History Narrative   Patient is divorced with one child.   Patient is right handed.   Patient has college education.    Patient drinks 2 cups daily.    Family History  Problem Relation Age of Onset  . Heart failure Mother   . Heart disease Mother   . Kidney disease Mother   . Diabetes Mother   . Hypertension Maternal Grandfather   .  Stroke Maternal Grandfather   . Heart attack Neg Hx   . Diabetes Father      Review of Systems:  See HPI for pertinent ROS. All other ROS negative.    Physical Exam: Blood pressure 126/70, pulse 91, temperature 97.8 F (36.6 C), temperature source Oral, resp. rate 16, weight 154 lb (69.9 kg), SpO2 98 %., Body mass index is 30.08 kg/m. General: WNWD WF. Appears in no acute distress. Neck: Supple. No thyromegaly. No lymphadenopathy. Left CarotidEndoArterectomy Scar. Lungs: Clear bilaterally to auscultation without wheezes, rales, or rhonchi. Breathing is unlabored. Heart: RRR with S1 S2. No murmurs, rubs, or gallops. Abdomen: Soft, non-tender, non-distended with normoactive bowel sounds. No hepatomegaly. No rebound/guarding. No obvious abdominal masses. Musculoskeletal:  Strength and tone normal for age. Extremities/Skin: Warm and dry.  No LE edema.  Neuro: Alert and oriented X 3. Moves all extremities spontaneously. Gait is normal. CNII-XII grossly in tact. Psych:  Responds to questions appropriately with a normal affect.     ASSESSMENT AND PLAN:  65 y.o. year old female with   Hyperglycemia At OV 03/23/16 I gave and reviewed low carbohydrate handout. Recheck A1c now  Essential hypertension BP at goal.  Continue current medications. Check lab to monitor. - COMPLETE METABOLIC PANEL WITH GFR - amLODipine (NORVASC) 10 MG tablet; Take 1 tablet (10 mg total) by mouth daily.  Dispense: 90 tablet; Refill: 2 - valsartan (DIOVAN) 80 MG tablet; Take 1 tablet (80 mg total) by mouth daily.  Dispense: 90 tablet; Refill: 2  H/O Ischemic stroke (HCC) See HPI for details.   Carotid stenosis, left H/O Left carotid endarterectomy.See HPI for details.  HLD  (hyperlipidemia) Goal LDL 70. Currently on Lipitor 80 mg. Check lab to monitor. - COMPLETE METABOLIC PANEL WITH GFR - Lipid panel - atorvastatin (LIPITOR) 80 MG tablet; TAKE 1 TABLET BY MOUTH DAILY AT 6:00PM  Dispense: 90 tablet; Refill: 2  Aneurysm, cerebral, nonruptured See history of present illness for detail. She had evaluation by Dr.Nundkumar--decided no surgery needed  Anxiety She has significant anxiety. Even during her initial visit with me 03/12/16 she demonstrates significant anxiety when discussing her PTSD and also when discussing her blood pressure.  Depression This is stable and controlled.  At past visit I did mention whether she would want to do complete physical exam and preventive care. She states that she does not want to do any preventive care. She states that she only wants to treat these current problems and does not want to do any further evaluation.  F/U OV 6 months, sooner if needed  Signed, [facility], [place], [facility] 10/01/2016 9:29 AM

## 2016-10-02 LAB — HEMOGLOBIN A1C
HEMOGLOBIN A1C: 5.8 % — AB (ref <5.7)
MEAN PLASMA GLUCOSE: 120 mg/dL

## 2016-11-16 NOTE — Addendum Note (Signed)
Addended by: Burton ApleyPETTY, Janiyah Beery A on: 11/16/2016 11:29 AM   Modules accepted: Orders

## 2016-12-21 ENCOUNTER — Other Ambulatory Visit: Payer: Self-pay | Admitting: Physician Assistant

## 2016-12-21 DIAGNOSIS — I10 Essential (primary) hypertension: Secondary | ICD-10-CM

## 2016-12-21 DIAGNOSIS — E785 Hyperlipidemia, unspecified: Secondary | ICD-10-CM

## 2016-12-21 NOTE — Telephone Encounter (Signed)
rx filled per protocol  

## 2017-01-25 ENCOUNTER — Other Ambulatory Visit: Payer: Self-pay | Admitting: Physician Assistant

## 2017-01-25 DIAGNOSIS — I10 Essential (primary) hypertension: Secondary | ICD-10-CM

## 2017-01-25 NOTE — Telephone Encounter (Signed)
refill appropriate  

## 2017-03-04 DIAGNOSIS — H524 Presbyopia: Secondary | ICD-10-CM | POA: Diagnosis not present

## 2017-03-04 DIAGNOSIS — Z0100 Encounter for examination of eyes and vision without abnormal findings: Secondary | ICD-10-CM | POA: Diagnosis not present

## 2017-04-01 ENCOUNTER — Encounter: Payer: Self-pay | Admitting: Physician Assistant

## 2017-04-01 ENCOUNTER — Ambulatory Visit (INDEPENDENT_AMBULATORY_CARE_PROVIDER_SITE_OTHER): Payer: Medicare HMO | Admitting: Physician Assistant

## 2017-04-01 VITALS — BP 138/70 | HR 84 | Temp 97.7°F | Resp 16 | Wt 151.2 lb

## 2017-04-01 DIAGNOSIS — R739 Hyperglycemia, unspecified: Secondary | ICD-10-CM | POA: Diagnosis not present

## 2017-04-01 DIAGNOSIS — I6529 Occlusion and stenosis of unspecified carotid artery: Secondary | ICD-10-CM | POA: Diagnosis not present

## 2017-04-01 DIAGNOSIS — E785 Hyperlipidemia, unspecified: Secondary | ICD-10-CM

## 2017-04-01 DIAGNOSIS — I671 Cerebral aneurysm, nonruptured: Secondary | ICD-10-CM

## 2017-04-01 DIAGNOSIS — F419 Anxiety disorder, unspecified: Secondary | ICD-10-CM | POA: Diagnosis not present

## 2017-04-01 DIAGNOSIS — I639 Cerebral infarction, unspecified: Secondary | ICD-10-CM

## 2017-04-01 DIAGNOSIS — I10 Essential (primary) hypertension: Secondary | ICD-10-CM | POA: Diagnosis not present

## 2017-04-01 LAB — COMPLETE METABOLIC PANEL WITH GFR
ALT: 17 U/L (ref 6–29)
AST: 19 U/L (ref 10–35)
Albumin: 4.4 g/dL (ref 3.6–5.1)
Alkaline Phosphatase: 104 U/L (ref 33–130)
BUN: 14 mg/dL (ref 7–25)
CALCIUM: 9.4 mg/dL (ref 8.6–10.4)
CHLORIDE: 105 mmol/L (ref 98–110)
CO2: 26 mmol/L (ref 20–31)
Creat: 0.69 mg/dL (ref 0.50–0.99)
GFR, Est African American: >89 mL/min (ref >=60)
GFR, Est Non African American: >89 mL/min (ref >=60)
GLUCOSE: 117 mg/dL — AB (ref 70–99)
POTASSIUM: 4.3 mmol/L (ref 3.5–5.3)
SODIUM: 139 mmol/L (ref 135–146)
Total Bilirubin: 0.6 mg/dL (ref 0.2–1.2)
Total Protein: 6.9 g/dL (ref 6.1–8.1)

## 2017-04-01 LAB — LIPID PANEL
CHOL/HDL RATIO: 3.2 ratio (ref <5.0)
Cholesterol: 128 mg/dL (ref <200)
HDL: 40 mg/dL — ABNORMAL LOW (ref >50)
LDL Cholesterol: 70 mg/dL (ref <100)
Triglycerides: 91 mg/dL (ref <150)
VLDL: 18 mg/dL (ref <30)

## 2017-04-01 NOTE — Progress Notes (Signed)
Patient ID: Diana Wells MRN: [id number], DOB: [birth year], 66 y.o. Date of Encounter: @  Chief Complaint:  Chief Complaint  Patient presents with  . Hyperlipidemia    67month f/u  . Hypertension  . rash on hand    HPI: 66 y.o. year old white female  presents with above.    She saw me as a new pt to Establish Care with our office 03/12/2016.  At that OV she reported that she just saw Neurology on Friday and "He released her and said to f/u with PCP" She was to f/u with PCP to keep BP controlled.   I reviewed Neuro OV note from 03/06/16 -- it stated "recommend maintain blood pressure goal around 130/80".  At prior visits she has relayed to me that she gets very worried about things--like making sure her BP is just right. But also has told me that since her stroke she has hard time keeping information correct and with being able to correctly express/ voice her thoughts.  She also has informed me that she has "PTSD--it is okay as long as she can exercise" and adds that she also has problems with claustrophobia. 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  Other pertinent information is from Neurology note dated 03/06/16. That note does state that she can follow-up with them PRN. Their note includes:  " CT of the brain 07/17/2014 Hypodensities within the left basal ganglia and within the left occipital lobe which can be seen with age-indeterminate/likely subacute infarcts. There is mild expansion of the left basal ganglia with associated mass effect. As these represent different vascular distributions (MCA and PCA) recommend further evaluation with MRI/MRA.  MRI of the brain 07/18/2014 1. Multi focal acute ischemic left MCA  territory ischemic infarcts involving the left basal ganglia and cortical and subcortical white matter of the left parietal and temporal lobes. No significant mass effect or evidence of hemorrhagic conversion. 2. Moderate chronic small vessel ischemic changes involving the supratentorial white matter and pons.  MRA of the brain 07/18/2014 1. 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 2. 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 3. Moderate multi focal atherosclerotic irregularity within the cavernous segments of the internal carotid arteries bilaterally and left vertebral artery.  CT Angio Neck 07/18/2014 1. Complex highly ulcerated plaque at the left carotid bifurcation. Short-segment proximal left ICA stenosis of up to 70 -75%. See sagittal image 134 of series 404. 2. No other hemodynamically significant stenosis in the neck. Great vessel origin and right carotid bifurcation plaque. 3. Dominant dolichoectatic left vertebral artery. 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 4. Non dominant right vertebral artery with soft and calcified plaque intracranially resulting in high-grade stenosis just proximal to the right PICA origin, and then right vertebral artery occlusion distal to the PICA.  Carotid Doppler Bilateral: Vertebral artery flow is antegrade. Right = 1-39% ICA stenosis. Left = 60-79% ICA stenosis.  2D Echocardiogram EF 55-60% with no source of embolus.  CXR 07/18/2014 No edema or consolidation. Atherosclerotic change with areas of calcification in the  aorta and left carotid artery.  EKG normal sinus rhythm, nonspecific ST and T waves changes  CUS 02/2015 - right < 40% and left patent.   Assessment: As you may recall, she is a 66 y.o. Caucasian female with PMH of HTN, smoking, and anxiety, was admitted on 07/17/2014 for  left MCA stroke, found to have left ICA stenosis 80%. Put on ASA and plavix as well as lipitor. Had LICA CEA by Dr. Myra Gianotti on 08/06/14. Also followed with Dr. Conchita Paris for left VA fusiform aneurysm which does not need intervention at this time. She is on aspirin and Lipitor now. She complained of transient blurry vision after taking HCTZ, and also dry cough which likely to be side effect from lisinopril. So her HCTZ and lisinopril were stopped and changed to novasc and diovan, so far BP controlled well at home. Speech much improved. Repeat CUS 02/2015 normal. Doing well during the interval time.   Plan:  - continue ASA and lipitor for stroke prevention  - continue amlodipine and diovan for BP control and check BP at home and record - Follow up with your primary care physician for stroke risk factor modification. Recommend maintain blood pressure goal around 130/80, diabetes with hemoglobin A1c goal below 6.5% and lipids with LDL cholesterol goal below 70 mg/dL.  - continue abstain from smoking - continue to follow up with vascular surgery - target HR on exercise 120-130, max 155 - speech exercise at home - follow up as needed  No orders of the defined types were placed in this encounter.   Patient Instructions  - continue ASA and lipitor for stroke prevention  - continue amlodipine and diovan for BP control and check BP at home and record - Follow up with your primary care physician for stroke risk factor modification. Recommend maintain blood pressure goal around 130/80, diabetes with hemoglobin A1c goal below 6.5% and lipids with LDL cholesterol goal below 70 mg/dL.  - continue abstain from smoking - continue to follow up with vascular surgery - target HR on exercise 120-130, max 155 - speech exercise at home - follow up as needed       At prior visit I asked whether she would want to do complete physical exam and preventive care. She stated that she does not want to do any  preventive care. She stated that she only wants to treat these current problems and does not want to do any further evaluation.  At initial OV here, lab reveled high glucose. A1C was added and was slightly elevated.  Low Carbohydrate diet informations was then reviewed, given to her.   At OV 06/2016--- Then she talked about the fact of how the stroke has affected her brain. Said often she cannot get out the words correctly.  York Spaniel  also she will flip numbers and say them in reverse order----" you can say 6.3 and I will then say 3.6" Said that she has a real problem with that unless she can see things in writing. Said that she also has difficulty processing information and even when she can see it in writing still has to keep looking at it and has difficulty processing information. York Spaniel this has happened regarding the low carbohydrate information I reviewed and gave her at last office visit. Also said that after her doctor's appointments, her daughter will ask her how the visit went and she can't think of anything to say at all. York Spaniel  that her daughter lives in [place] and works as a  fitness trainer. Also said that she has a friend who is an Charity fundraiser. At that visit I printed her last set of lab results and her last office visit and she was going to take these home and schedule a time for her daughter and her RN friend and herself to all get together and review this information and help her understand and process this and help her in regards to shopping and preparing foods.  10/01/2016: She has been following low Carb information. She has last more weight and is excited about this.  She is taking BP meds as directed. No lightheadedness or other adverse effects.  She is taking Lipitor as directed. No myalgias or other adverse effects.  She has no complaints or concerns today.   03/31/2017: Today she says she has been doing really well with her diet and exercise, "but is stuck at 149 lb" -- No matter  what she does, cannot go < 149. At end of visit, asks if she can ask me something---wants to talk to a therapist--but doesn't want anyone to know--confidential--her daughter doesn't even know---I gave her contact info for 2 therapists---told her to check with her insurance to find out if insurance covers certain ones.  Says she does have rash on the palmar aspect at the base of her hand. Says that she has changed to a different dish soap etc. Has applied lotion that is the only treatment she has tried. Otherwise she has been doing well. She is taking BP meds as directed. No lightheadedness or other adverse effects.  She is taking Lipitor as directed. No myalgias or other adverse effects.   Past Medical History:  Diagnosis Date  . Allergy   . Anxiety   . Aphasia    slight aphasia  . Depression   . GERD (gastroesophageal reflux disease)   . Hyperlipidemia   . Hypertension   . Peripheral vascular disease (HCC)    ? in arm lft  . Stroke (HCC)    8/15 earlier in month  . Vertebral artery aneurysm (HCC)    left VA aneursym by 07/2014 MRA (further eval by Dr. Conchita Paris following L CEA surgery)     Home Meds: Outpatient Medications Prior to Visit  Medication Sig Dispense Refill  . amLODipine (NORVASC) 10 MG tablet take 1 tablet by mouth once daily 90 tablet 1  . aspirin EC 81 MG EC tablet Take 1 tablet (81 mg total) by mouth daily. 30 tablet 0  . atorvastatin (LIPITOR) 80 MG tablet take 1 tablet by mouth once daily AT 6PM 90 tablet 1  . loratadine (CLARITIN) 10 MG tablet Take 10 mg by mouth daily as needed for allergies.    . valsartan (DIOVAN) 80 MG tablet take 1 tablet by mouth once daily 90 tablet 2   No facility-administered medications prior to visit.     Allergies:  Allergies  Allergen Reactions  . Codeine Other (See Comments)    Makes patient incoherent and involuntarily perform acts that she does not remember.   . Erythromycin Itching and Rash    Hallucinations  .  Penicillins Itching and Rash    Hallucinations   . Eggs Or Egg-Derived Products     Per pt "severely allergic" gets really sick  . Lisinopril Cough  . Ppd [Tuberculin Purified Protein Derivative] Hives  . Dopamine Hcl Nausea Only and Rash    Social History   Social History  . Marital status: Divorced    Spouse name: N/A  . Number  of children: 1  . Years of education: college   Occupational History  . retired    Social History Main Topics  . Smoking status: Former Smoker    Packs/day: 0.25    Years: 47.00    Types: Cigarettes    Quit date: 07/19/2014  . Smokeless tobacco: Never Used  . Alcohol use No  . Drug use: No  . Sexual activity: Not Currently   Other Topics Concern  . Not on file   Social History Narrative   Patient is divorced with one child.   Patient is right handed.   Patient has college education.   Patient drinks 2 cups daily.    Family History  Problem Relation Age of Onset  . Heart failure Mother   . Heart disease Mother   . Kidney disease Mother   . Diabetes Mother   . Hypertension Maternal Grandfather   . Stroke Maternal Grandfather   . Heart attack Neg Hx   . Diabetes Father      Review of Systems:  See HPI for pertinent ROS. All other ROS negative.    Physical Exam: Blood pressure 138/70, pulse 84, temperature 97.7 F (36.5 C), temperature source Oral, resp. rate 16, weight 151 lb 3.2 oz (68.6 kg), SpO2 98 %., Body mass index is 29.53 kg/m. General: WNWD WF. Appears in no acute distress. Neck: Supple. No thyromegaly. No lymphadenopathy. Left CarotidEndoArterectomy Scar. Lungs: Clear bilaterally to auscultation without wheezes, rales, or rhonchi. Breathing is unlabored. Heart: RRR with S1 S2. No murmurs, rubs, or gallops. Abdomen: Soft, non-tender, non-distended with normoactive bowel sounds. No hepatomegaly. No rebound/guarding. No obvious abdominal masses. Musculoskeletal:  Strength and tone normal for age. Extremities/Skin: Warm and  dry.  No LE edema. Palmar aspect, base of hand-- mild erythema, scale Neuro: Alert and oriented X 3. Moves all extremities spontaneously. Gait is normal. CNII-XII grossly in tact. Psych:  Responds to questions appropriately with a normal affect. She does get nervous, anxious--when quietly asking about needing to talk to a therapist     ASSESSMENT AND PLAN:  66 y.o. year old female with   Hyperglycemia At OV 03/23/16 I gave and reviewed low carbohydrate handout. 03/31/2017: She is compliant with low carb diet. Recheck A1c now  Essential hypertension 03/31/2017:BP at goal.  Continue current medications. Check lab to monitor. - COMPLETE METABOLIC PANEL WITH GFR - amLODipine (NORVASC) 10 MG tablet; Take 1 tablet (10 mg total) by mouth daily.  Dispense: 90 tablet; Refill: 2 - valsartan (DIOVAN) 80 MG tablet; Take 1 tablet (80 mg total) by mouth daily.  Dispense: 90 tablet; Refill: 2  H/O Ischemic stroke (HCC) 03/31/2017:See HPI for details.   Carotid stenosis, left 03/31/2017:H/O Left carotid endarterectomy.See HPI for details.  HLD (hyperlipidemia) 03/31/2017:Goal LDL 70.  Currently on Lipitor 80 mg.  Check lab to monitor.  - COMPLETE METABOLIC PANEL WITH GFR  - Lipid panel  - atorvastatin (LIPITOR) 80 MG tablet; TAKE 1 TABLET BY MOUTH DAILY AT 6:00PM  Dispense: 90 tablet; Refill: 2  Aneurysm, cerebral, nonruptured 03/31/2017:See history of present illness for detail.       She had evaluation by Dr.Nundkumar--decided no surgery needed  Anxiety 03/31/2017:She has significant anxiety.  Even during her initial visit with me 03/12/16 she demonstrates significant anxiety when discussing her PTSD and also when discussing her blood pressure.  At OV 03/31/2017---demonstrates significant anxiety when discussing talking with a therapist  Depression 03/31/2017:This is stable and controlled.  Eczema of hands: 03/31/2017:Apply Hydrocortisone Cream  Wear gloves when washing dishes or doing any  cleaning   At past visit I did mention whether she would want to do complete physical exam and preventive care. She states that she does not want to do any preventive care. She states that she only wants to treat these current problems and does not want to do any further evaluation.  F/U OV 6 months, sooner if needed  Signed, [facility] [place], [place], [facility] 04/01/2017 8:28 AM

## 2017-04-02 LAB — HEMOGLOBIN A1C
HEMOGLOBIN A1C: 5.9 % — AB (ref <5.7)
MEAN PLASMA GLUCOSE: 123 mg/dL

## 2017-06-15 ENCOUNTER — Other Ambulatory Visit: Payer: Self-pay | Admitting: Physician Assistant

## 2017-06-15 DIAGNOSIS — I10 Essential (primary) hypertension: Secondary | ICD-10-CM

## 2017-06-15 DIAGNOSIS — E785 Hyperlipidemia, unspecified: Secondary | ICD-10-CM

## 2017-07-12 ENCOUNTER — Telehealth: Payer: Self-pay | Admitting: Physician Assistant

## 2017-07-12 DIAGNOSIS — I10 Essential (primary) hypertension: Secondary | ICD-10-CM

## 2017-07-12 NOTE — Telephone Encounter (Signed)
Tell Pt that I JUST received information regarding this re-call.  Remove the Valsartan from her med list.  Start Losartan 50MG  1po QD # 30 + 5. Have her come in for OV in 2 weeks -- will recheck BP and lab on new medication to make sure everything is controlled on new med.

## 2017-07-12 NOTE — Telephone Encounter (Signed)
Pls see note below there has been a recall on Valsartan   Pls advise

## 2017-07-12 NOTE — Telephone Encounter (Signed)
Patient is calling regarding the recall on her bp med, please call and advise what she should do  [phone]

## 2017-07-13 MED ORDER — LOSARTAN POTASSIUM 50 MG PO TABS: 50.0000 mg | ORAL_TABLET | Freq: Every day | ORAL | 0 refills | Status: DC

## 2017-07-13 NOTE — Telephone Encounter (Signed)
Spoke with patient she states she will monitor her b/p at home and if it is abnormal then she will contact our office. Patient states she has an appt schedule for 09/30/2017 and will have her b/p checked then

## 2017-08-11 ENCOUNTER — Encounter: Payer: Self-pay | Admitting: Family

## 2017-08-20 ENCOUNTER — Ambulatory Visit (INDEPENDENT_AMBULATORY_CARE_PROVIDER_SITE_OTHER): Payer: Medicare HMO | Admitting: Vascular Surgery

## 2017-08-20 ENCOUNTER — Ambulatory Visit (HOSPITAL_COMMUNITY)
Admission: RE | Admit: 2017-08-20 | Discharge: 2017-08-20 | Disposition: A | Discharge status: Home / Self Care | Payer: Medicare HMO | Source: Ambulatory Visit | Attending: Vascular Surgery | Admitting: Vascular Surgery

## 2017-08-20 VITALS — BP 139/80 | HR 79 | Temp 97.2°F | Resp 16 | Ht 60.0 in | Wt 146.0 lb

## 2017-08-20 DIAGNOSIS — I6521 Occlusion and stenosis of right carotid artery: Secondary | ICD-10-CM | POA: Diagnosis not present

## 2017-08-20 DIAGNOSIS — Z9889 Other specified postprocedural states: Secondary | ICD-10-CM | POA: Diagnosis not present

## 2017-08-20 DIAGNOSIS — I6522 Occlusion and stenosis of left carotid artery: Secondary | ICD-10-CM

## 2017-08-20 DIAGNOSIS — Z87891 Personal history of nicotine dependence: Secondary | ICD-10-CM | POA: Diagnosis not present

## 2017-08-20 LAB — VAS US CAROTID
LCCADDIAS: 24 cm/s
LCCADSYS: 108 cm/s
LCCAPSYS: 117 cm/s
LEFT ECA DIAS: 17 cm/s
LEFT VERTEBRAL DIAS: -23 cm/s
LICADDIAS: -23 cm/s
LICADSYS: -71 cm/s
Left CCA prox dias: 29 cm/s
Left ICA prox dias: 31 cm/s
Left ICA prox sys: 86 cm/s
RCCADSYS: -96 cm/s
RCCAPSYS: 107 cm/s
RIGHT CCA MID DIAS: 23 cm/s
RIGHT ECA DIAS: -21 cm/s
RIGHT VERTEBRAL DIAS: -13 cm/s
Right CCA prox dias: 18 cm/s

## 2017-08-20 NOTE — Progress Notes (Signed)
[facility]  Patient name: Diana Wells MRN: [id number] DOB: December 27, 1950 Sex: female  REASON FOR VISIT: follow-up  HPI:    Diana Wells is a 66 y.o. female who presents for continued follow-up status post left carotid endarterectomy on 08/06/2014 by Dr. Myra Gianotti. This was done for a symptomatic high-grade left carotid stenosis. The patient's main symptoms were expressive aphasia. The patient states that her speech has been almost normal except for having issues finding words sometimes. She denies any episodes of amaurosis fugax, sudden onset weakness or numbness of extremities, slurred speech and receptive aphasia.  The patient continues to eat a mostly plant based diet with some meat. She is very active and goes to the gym regularly. She takes a daily aspirin and statin. Her hypertension is well controlled on losartan. She is a former smoker.  She denies any claudication issues. She does have some lateral right heel pain that is likely secondary to exercise.  PAST MEDICAL HISTORY:   Past Medical History:  Diagnosis Date  . Allergy   . Anxiety   . Aphasia    slight aphasia  . Depression   . GERD (gastroesophageal reflux disease)   . Hyperlipidemia   . Hypertension   . Peripheral vascular disease (HCC)    ? in arm lft  . Stroke (HCC)    8/15 earlier in month  . Vertebral artery aneurysm (HCC)    left VA aneursym by 07/2014 MRA (further eval by Dr. Conchita Paris following L CEA surgery)    Family History  Problem Relation Age of Onset  . Heart failure Mother   . Heart disease Mother   . Kidney disease Mother   . Diabetes Mother   . Hypertension Maternal Grandfather   . Stroke Maternal Grandfather   . Heart attack Neg Hx   . Diabetes Father     Social History  Substance Use Topics  . Smoking status: Former Smoker    Packs/day: 0.25    Years: 47.00    Types: Cigarettes    Quit date: 07/19/2014  . Smokeless tobacco: Never Used  . Alcohol use No     Allergies  Allergen Reactions  . Codeine Other (See Comments)    Makes patient incoherent and involuntarily perform acts that she does not remember.   . Erythromycin Itching and Rash    Hallucinations  . Penicillins Itching and Rash    Hallucinations   . Eggs Or Egg-Derived Products     Per pt "severely allergic" gets really sick  . Lisinopril Cough  . Ppd [Tuberculin Purified Protein Derivative] Hives  . Dopamine Hcl Nausea Only and Rash    MEDICATIONS:   Current Outpatient Prescriptions  Medication Sig Dispense Refill  . aspirin EC 81 MG EC tablet Take 1 tablet (81 mg total) by mouth daily. 30 tablet 0  . atorvastatin (LIPITOR) 80 MG tablet take 1 tablet by mouth once daily AT 6PM 90 tablet 1  . loratadine (CLARITIN) 10 MG tablet Take 10 mg by mouth daily as needed for allergies.    Marland Kitchen losartan (COZAAR) 50 MG tablet Take 1 tablet (50 mg total) by mouth daily. 90 tablet 0   No current facility-administered medications for this visit.     REVIEW OF SYSTEMS:   REVIEW OF SYSTEMS (negative unless checked):   Cardiac:   Chest pain or chest pressure?  Shortness of breath upon activity?  Shortness of breath when lying flat?  Irregular heart rhythm?  Vascular:    Pain in calf, thigh, or hip brought on by walking? []  Pain in feet at night that wakes you up from your sleep? []  Blood clot in your veins? []  Leg swelling?  Pulmonary:  []  Oxygen at home? []  Productive cough? []  Wheezing?  Neurologic:  []  Sudden weakness in arms or legs? []  Sudden numbness in arms or legs? []  Sudden onset of difficult speaking or slurred speech? []  Temporary loss of vision in one eye? []  Problems with dizziness?  Gastrointestinal:  []  Blood in stool? []  Vomited blood?  Genitourinary:  []  Burning when urinating? []  Blood in urine?  Psychiatric:  []  Major depression  Hematologic:  []  Bleeding problems? []  Problems with blood clotting?  Dermatologic:  []  Rashes or  ulcers?  Constitutional:  []  Fever or chills?  Ear/Nose/Throat:  []  Change in hearing? []  Nose bleeds? []  Sore throat?  Musculoskeletal:  []  Back pain? []  Joint pain? []  Muscle pain?  PHYSICAL EXAM:   Vitals:   08/20/17 1233 08/20/17 1240  BP: 140/81 139/80  Pulse: 79   Resp: 16   Temp: (!) 97.2 F (36.2 C)   TempSrc: Oral   SpO2: 95%   Weight: 146 lb (66.2 kg)   Height: 5' (1.524 m)     GENERAL: The patient is a well-nourished female, in no acute distress. The vital signs are documented above. HEENT: normocephalic, atraumatic. No abnormalities noted.  CARDIAC: There is a regular rate and rhythm. No carotid bruits. VASCULAR: 2+ radial pulses bilaterally. 2+ posterior tibial pulses bilaterally. 1+ right dorsalis pedis pulse. 2+ left dorsalis pedis pulse. PULMONARY: There is good air exchange bilaterally without wheezing or rales. ABDOMEN: Soft and non-tender with no pulsatile mass. MUSCULOSKELETAL: There are no major deformities or cyanosis. NEUROLOGIC: 5 out of 5 strength upper and lower extremity bilaterally. SKIN: There are no ulcers or rashes noted. PSYCHIATRIC: The patient has a normal affect.  DATA:    Carotid duplex 08/20/2017  Left carotid endarterectomy site without any evidence of restenosis. Right internal carotid artery with less than 40% stenosis. Vertebral arteries with antegrade flow bilaterally.  ASSESSMENT/PLAN:  S/p left carotid endarterectomy  <40% right internal carotid artery stenosis  The patient is doing well. She is not had any signs or symptoms of a TIA or stroke. She takes a daily aspirin and statin. She continues to eat a mostly plant based diet and exercises regularly. She will follow up in 1 year with repeat carotid duplex.  Maris BergerKimberly Stan Cantave, PA-C [facility] MD: Imogene Burnhen

## 2017-09-15 NOTE — Addendum Note (Signed)
Addended by: Burton Apley A on: 09/15/2017 12:36 PM   Modules accepted: Orders

## 2017-09-30 ENCOUNTER — Encounter: Payer: Self-pay | Admitting: Physician Assistant

## 2017-09-30 ENCOUNTER — Ambulatory Visit (INDEPENDENT_AMBULATORY_CARE_PROVIDER_SITE_OTHER): Payer: Medicare HMO | Admitting: Physician Assistant

## 2017-09-30 VITALS — BP 134/76 | HR 80 | Temp 97.8°F | Resp 18 | Wt 145.4 lb

## 2017-09-30 DIAGNOSIS — I639 Cerebral infarction, unspecified: Secondary | ICD-10-CM | POA: Diagnosis not present

## 2017-09-30 DIAGNOSIS — I671 Cerebral aneurysm, nonruptured: Secondary | ICD-10-CM

## 2017-09-30 DIAGNOSIS — E785 Hyperlipidemia, unspecified: Secondary | ICD-10-CM | POA: Diagnosis not present

## 2017-09-30 DIAGNOSIS — I6529 Occlusion and stenosis of unspecified carotid artery: Secondary | ICD-10-CM | POA: Diagnosis not present

## 2017-09-30 DIAGNOSIS — I10 Essential (primary) hypertension: Secondary | ICD-10-CM | POA: Diagnosis not present

## 2017-09-30 DIAGNOSIS — F419 Anxiety disorder, unspecified: Secondary | ICD-10-CM | POA: Diagnosis not present

## 2017-09-30 DIAGNOSIS — R739 Hyperglycemia, unspecified: Secondary | ICD-10-CM | POA: Diagnosis not present

## 2017-09-30 NOTE — Progress Notes (Signed)
Patient ID: Diana Wells MRN: [id number], DOB: [birth year], 66 y.o. Date of Encounter: @DATE @  Chief Complaint:  Chief Complaint  Patient presents with  . 6 mth check up    is fasting    HPI: 66 y.o. year old white female  presents with above.    She saw me as a new pt to Establish Care with our office 03/12/2016.  At that OV she reported that she just saw Neurology on Friday and "He released her and said to f/u with PCP" She was to f/u with PCP to keep BP controlled.   I reviewed Neuro OV note from 03/06/16 -- it stated "recommend maintain blood pressure goal around 130/80".  At prior visits she has relayed to me that she gets very worried about things--like making sure her BP is just right. But also has told me that since her stroke she has hard time keeping information correct and with being able to correctly express/ voice her thoughts.  She also has informed me that she has "PTSD--it is okay as long as she can exercise" and adds that she also has problems with claustrophobia. 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  Other pertinent information is from Neurology note dated 03/06/16. That note does state that she can follow-up with them PRN. Their note includes:  " CT of the brain 07/17/2014 Hypodensities within the left basal ganglia and within the left occipital lobe which can be seen with age-indeterminate/likely subacute infarcts. There is mild expansion of the left basal ganglia with associated mass effect. As these represent different vascular distributions (MCA and PCA) recommend further evaluation with MRI/MRA.  MRI of the brain 07/18/2014 1. Multi focal acute ischemic left MCA territory ischemic infarcts  involving the left basal ganglia and cortical and subcortical white matter of the left parietal and temporal lobes. No significant mass effect or evidence of hemorrhagic conversion. 2. Moderate chronic small vessel ischemic changes involving the supratentorial white matter and pons.  MRA of the brain 07/18/2014 1. 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 2. 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 3. Moderate multi focal atherosclerotic irregularity within the cavernous segments of the internal carotid arteries bilaterally and left vertebral artery.  CT Angio Neck 07/18/2014 1. Complex highly ulcerated plaque at the left carotid bifurcation. Short-segment proximal left ICA stenosis of up to 70 -75%. See sagittal image 134 of series 404. 2. No other hemodynamically significant stenosis in the neck. Great vessel origin and right carotid bifurcation plaque. 3. Dominant dolichoectatic left vertebral artery. 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 4. Non dominant right vertebral artery with soft and calcified plaque intracranially resulting in high-grade stenosis just proximal to the right PICA origin, and then right vertebral artery occlusion distal to the PICA.  Carotid Doppler Bilateral: Vertebral artery flow is antegrade. Right = 1-39% ICA stenosis. Left = 60-79% ICA stenosis.  2D Echocardiogram EF 55-60% with no source of embolus.  CXR 07/18/2014 No edema or consolidation. Atherosclerotic change with areas of calcification in the aorta and left carotid artery.  EKG normal sinus rhythm, nonspecific ST and T waves changes  CUS 02/2015 - right < 40% and left patent.   Assessment: As you may recall, she is a 66 y.o. Caucasian female with PMH of HTN, smoking, and anxiety, was admitted on 07/17/2014 for left MCA stroke, found to  have left ICA stenosis 80%. Put on ASA and plavix as well as lipitor. Had LICA CEA by Dr. Myra Gianotti on 08/06/14. Also followed with Dr. Conchita Paris for left VA fusiform aneurysm which does not need intervention at this time. She is on aspirin and Lipitor now. She complained of transient blurry vision after taking HCTZ, and also dry cough which likely to be side effect from lisinopril. So her HCTZ and lisinopril were stopped and changed to novasc and diovan, so far BP controlled well at home. Speech much improved. Repeat CUS 02/2015 normal. Doing well during the interval time.   Plan:  - continue ASA and lipitor for stroke prevention  - continue amlodipine and diovan for BP control and check BP at home and record - Follow up with your primary care physician for stroke risk factor modification. Recommend maintain blood pressure goal around 130/80, diabetes with hemoglobin A1c goal below 6.5% and lipids with LDL cholesterol goal below 70 mg/dL.  - continue abstain from smoking - continue to follow up with vascular surgery - target HR on exercise 120-130, max 155 - speech exercise at home - follow up as needed  No orders of the defined types were placed in this encounter.   Patient Instructions  - continue ASA and lipitor for stroke prevention  - continue amlodipine and diovan for BP control and check BP at home and record - Follow up with your primary care physician for stroke risk factor modification. Recommend maintain blood pressure goal around 130/80, diabetes with hemoglobin A1c goal below 6.5% and lipids with LDL cholesterol goal below 70 mg/dL.  - continue abstain from smoking - continue to follow up with vascular surgery - target HR on exercise 120-130, max 155 - speech exercise at home - follow up as needed       At prior visit I asked whether she would want to do complete physical exam and preventive care. She stated that she does not want to do any preventive care. She stated  that she only wants to treat these current problems and does not want to do any further evaluation.  At initial OV here, lab reveled high glucose. A1C was added and was slightly elevated.  Low Carbohydrate diet informations was then reviewed, given to her.   At OV 06/2016--- Then she talked about the fact of how the stroke has affected her brain. Said often she cannot get out the words correctly.  York Spaniel  also she will flip numbers and say them in reverse order----" you can say 6.3 and I will then say 3.6" Said that she has a real problem with that unless she can see things in writing. Said that she also has difficulty processing information and even when she can see it in writing still has to keep looking at it and has difficulty processing information. York Spaniel this has happened regarding the low carbohydrate information I reviewed and gave her at last office visit. Also said that after her doctor's appointments, her daughter will ask her how the visit went and she can't think of anything to say at all. York Spaniel  that her daughter lives in [place] and works as a Chief Executive Officer. Also said that she  has a friend who is an Charity fundraiser. At that visit I printed her last set of lab results and her last office visit and she was going to take these home and schedule a time for her daughter and her RN friend and herself to all get together and review this information and help her understand and process this and help her in regards to shopping and preparing foods.  10/01/2016: She has been following low Carb information. She has last more weight and is excited about this.  She is taking BP meds as directed. No lightheadedness or other adverse effects.  She is taking Lipitor as directed. No myalgias or other adverse effects.  She has no complaints or concerns today.   03/31/2017: Today she says she has been doing really well with her diet and exercise, "but is stuck at 149 lb" -- No matter what she does, cannot go <  149. At end of visit, asks if she can ask me something---wants to talk to a therapist--but doesn't want anyone to know--confidential--her daughter doesn't even know---I gave her contact info for 2 therapists---told her to check with her insurance to find out if insurance covers certain ones.  Says she does have rash on the palmar aspect at the base of her hand. Says that she has changed to a different dish soap etc. Has applied lotion that is the only treatment she has tried. Otherwise she has been doing well. She is taking BP meds as directed. No lightheadedness or other adverse effects.  She is taking Lipitor as directed. No myalgias or other adverse effects.    09/30/2017: Today she reports that she's been doing very well. Reports that she realized that she needed to stop her bread and her sugar. Had not realized how many carbohydrates are in one piece of bread. Regarding the sugar says that the big thing was sweet tea. Has always liked sweet tea. Has changed to getting unsweet tea and adding Stevia. Continues to walk 2-3 miles every day. Asked if she ended up following up with therapist after her last visit with me. Was interested to have her feedback regarding the therapist. However she states that she ended up not having to go to a therapist. 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 She states that this is her therapy. Says that it gives her purpose and it gives her something positive to do. States that when she has had her blood pressure checked it has been running good with good readings. She is taking her blood pressure medicines as directed. She is having no lightheadedness, no lower extremity edema, no other adverse effects. She is taking Lipitor as directed. No myalgias or other adverse effects. She recently had follow-up visit with vascular and had follow-up carotid artery Doppler which  was good.Left carotid endarterectomy site remains patent. Right carotid stenosis less than 40%. Was no significant change compared to study from 08/2016. She is in very good spirits today and is smiling and happy through the visit.     Past Medical History:  Diagnosis Date  . Allergy   . Anxiety   . Aphasia    slight aphasia  . Depression   . GERD (gastroesophageal reflux disease)   . Hyperlipidemia   . Hypertension   . Peripheral vascular disease (HCC)    ? in arm lft  . Stroke (HCC)    8/15 earlier in  month  . Vertebral artery aneurysm (HCC)    left VA aneursym by 07/2014 MRA (further eval by Dr. Conchita Paris following L CEA surgery)     Home Meds: Outpatient Medications Prior to Visit  Medication Sig Dispense Refill  . aspirin EC 81 MG EC tablet Take 1 tablet (81 mg total) by mouth daily. 30 tablet 0  . atorvastatin (LIPITOR) 80 MG tablet take 1 tablet by mouth once daily AT 6PM 90 tablet 1  . loratadine (CLARITIN) 10 MG tablet Take 10 mg by mouth daily as needed for allergies.    Marland Kitchen losartan (COZAAR) 50 MG tablet Take 1 tablet (50 mg total) by mouth daily. 90 tablet 0   No facility-administered medications prior to visit.     Allergies:  Allergies  Allergen Reactions  . Codeine Other (See Comments)    Makes patient incoherent and involuntarily perform acts that she does not remember.   . Erythromycin Itching and Rash    Hallucinations  . Penicillins Itching and Rash    Hallucinations   . Eggs Or Egg-Derived Products     Per pt "severely allergic" gets really sick  . Lisinopril Cough  . Ppd [Tuberculin Purified Protein Derivative] Hives  . Dopamine Hcl Nausea Only and Rash    Social History   Social History  . Marital status: Divorced    Spouse name: N/A  . Number of children: 1  . Years of education: college   Occupational History  . retired    Social History Main Topics  . Smoking status: Former Smoker    Packs/day: 0.25    Years: 47.00    Types:  Cigarettes    Quit date: 07/19/2014  . Smokeless tobacco: Never Used  . Alcohol use No  . Drug use: No  . Sexual activity: Not Currently   Other Topics Concern  . Not on file   Social History Narrative   Patient is divorced with one child.   Patient is right handed.   Patient has college education.   Patient drinks 2 cups daily.    Family History  Problem Relation Age of Onset  . Heart failure Mother   . Heart disease Mother   . Kidney disease Mother   . Diabetes Mother   . Hypertension Maternal Grandfather   . Stroke Maternal Grandfather   . Heart attack Neg Hx   . Diabetes Father      Review of Systems:  See HPI for pertinent ROS. All other ROS negative.    Physical Exam: Blood pressure 134/76, pulse 80, temperature 97.8 F (36.6 C), temperature source Oral, resp. rate 18, weight 66 kg (145 lb 6.4 oz)., Body mass index is 28.4 kg/m. General: WNWD WF. Appears in no acute distress. Neck: Supple. No thyromegaly. No lymphadenopathy. Left CarotidEndoArterectomy Scar. Lungs: Clear bilaterally to auscultation without wheezes, rales, or rhonchi. Breathing is unlabored. Heart: RRR with S1 S2. No murmurs, rubs, or gallops. Abdomen: Soft, non-tender, non-distended with normoactive bowel sounds. No hepatomegaly. No rebound/guarding. No obvious abdominal masses. Musculoskeletal:  Strength and tone normal for age. Extremities/Skin: Warm and dry.  No LE edema.  Neuro: Alert and oriented X 3. Moves all extremities spontaneously. Gait is normal. CNII-XII grossly in tact. Psych:  Responds to questions appropriately with a normal affect. She is very happy and in good spirits today---never became anxious at all during today's visit.     ASSESSMENT AND PLAN:  66 y.o. year old female with   Hyperglycemia At OV 03/23/16 I  gave and reviewed low carbohydrate handout. 09/30/2017: She is compliant with low carb diet. He is compliant with exercise. Walks 2-3 miles per day. Has a  treadmill.Recheck A1c now  Essential hypertension 09/30/2017:BP at goal.  Continue current medications. Check lab to monitor.  H/O Ischemic stroke (HCC) 1018/2018:See HPI for details.   Carotid stenosis, left 09/30/2017:H/O Left carotid endarterectomy.See HPI for details.Managed by Vascular----has had routine f/u OV and Dopplers to f/u, manage this  HLD (hyperlipidemia) 09/30/2017:Goal LDL 70.  Currently on Lipitor 80 mg.  Check lab to monitor.  - COMPLETE METABOLIC PANEL WITH GFR  - Lipid panel  - atorvastatin (LIPITOR) 80 MG tablet; TAKE 1 TABLET BY MOUTH DAILY AT 6:00PM  Dispense: 90 tablet; Refill: 2  Aneurysm, cerebral, nonruptured 09/30/2017:See history of present illness for detail.       She had evaluation by Dr.Nundkumar--decided no surgery needed  Anxiety 09/30/2017:She has h/o significant anxiety.  Even during her initial visit with me 03/12/16 she demonstrates significant anxiety when discussing her PTSD and also when discussing her blood pressure.  At OV 03/31/2017---demonstrates significant anxiety when discussing talking with a therapist  At OV 09/30/2017----she demonstrates no anxiety throughout the entire visit today. Today she is smiling and happy and content.  Depression 09/30/2017:This is stable and controlled.  Eczema of hands: 03/31/2017:Apply Hydrocortisone Cream       Wear gloves when washing dishes or doing any cleaning   At past visit I did mention whether she would want to do complete physical exam and preventive care. She states that she does not want to do any preventive care. She states that she only wants to treat these current problems and does not want to do any further evaluation.  At OV 09/30/2017---I specifically asked about doing a mammogram. She does not want to do a mammogram and again says that she does not want to do any additional screenings.  F/U OV 6 months, sooner if needed  Signed, [facility] HospitalMary Beth Golden GateDixon, [place], [facility] Center[facility] 09/30/2017 8:06  AM

## 2017-10-01 LAB — COMPLETE METABOLIC PANEL WITH GFR
AG RATIO: 2 (calc) (ref 1.0–2.5)
ALT: 20 U/L (ref 6–29)
AST: 20 U/L (ref 10–35)
Albumin: 4.6 g/dL (ref 3.6–5.1)
Alkaline phosphatase (APISO): 109 U/L (ref 33–130)
BUN: 12 mg/dL (ref 7–25)
CALCIUM: 9.5 mg/dL (ref 8.6–10.4)
CO2: 25 mmol/L (ref 20–32)
Chloride: 104 mmol/L (ref 98–110)
Creat: 0.71 mg/dL (ref 0.50–0.99)
GFR, EST AFRICAN AMERICAN: 103 mL/min/{1.73_m2} (ref >=60)
GFR, EST NON AFRICAN AMERICAN: 89 mL/min/{1.73_m2} (ref >=60)
GLOBULIN: 2.3 g/dL (ref 1.9–3.7)
Glucose, Bld: 107 mg/dL — ABNORMAL HIGH (ref 65–99)
POTASSIUM: 4.6 mmol/L (ref 3.5–5.3)
SODIUM: 139 mmol/L (ref 135–146)
Total Bilirubin: 0.7 mg/dL (ref 0.2–1.2)
Total Protein: 6.9 g/dL (ref 6.1–8.1)

## 2017-10-01 LAB — LIPID PANEL
Cholesterol: 142 mg/dL (ref <200)
HDL: 47 mg/dL — AB (ref >50)
LDL Cholesterol (Calc): 77 mg/dL (calc)
NON-HDL CHOLESTEROL (CALC): 95 mg/dL (ref <130)
Total CHOL/HDL Ratio: 3 (calc) (ref <5.0)
Triglycerides: 95 mg/dL (ref <150)

## 2017-10-01 LAB — HEMOGLOBIN A1C
Hgb A1c MFr Bld: 5.6 % of total Hgb (ref <5.7)
Mean Plasma Glucose: 114 (calc)
eAG (mmol/L): 6.3 (calc)

## 2017-10-11 ENCOUNTER — Other Ambulatory Visit: Payer: Self-pay | Admitting: Physician Assistant

## 2017-10-11 NOTE — Telephone Encounter (Signed)
Refill appropriate 

## 2017-12-13 ENCOUNTER — Other Ambulatory Visit: Payer: Self-pay | Admitting: Physician Assistant

## 2017-12-13 DIAGNOSIS — I10 Essential (primary) hypertension: Secondary | ICD-10-CM

## 2017-12-20 ENCOUNTER — Other Ambulatory Visit: Payer: Self-pay | Admitting: Physician Assistant

## 2017-12-20 DIAGNOSIS — E785 Hyperlipidemia, unspecified: Secondary | ICD-10-CM

## 2017-12-20 NOTE — Telephone Encounter (Signed)
Refill appropriate 

## 2018-03-31 ENCOUNTER — Ambulatory Visit: Payer: Medicare HMO | Admitting: Physician Assistant

## 2018-04-04 ENCOUNTER — Ambulatory Visit (INDEPENDENT_AMBULATORY_CARE_PROVIDER_SITE_OTHER): Payer: Medicare HMO | Admitting: Physician Assistant

## 2018-04-04 ENCOUNTER — Encounter: Payer: Self-pay | Admitting: Physician Assistant

## 2018-04-04 ENCOUNTER — Other Ambulatory Visit: Payer: Self-pay

## 2018-04-04 VITALS — BP 142/80 | HR 88 | Temp 98.1°F | Resp 16 | Ht 60.0 in | Wt 142.2 lb

## 2018-04-04 DIAGNOSIS — I671 Cerebral aneurysm, nonruptured: Secondary | ICD-10-CM

## 2018-04-04 DIAGNOSIS — I639 Cerebral infarction, unspecified: Secondary | ICD-10-CM | POA: Diagnosis not present

## 2018-04-04 DIAGNOSIS — F419 Anxiety disorder, unspecified: Secondary | ICD-10-CM | POA: Diagnosis not present

## 2018-04-04 DIAGNOSIS — E785 Hyperlipidemia, unspecified: Secondary | ICD-10-CM | POA: Diagnosis not present

## 2018-04-04 DIAGNOSIS — R739 Hyperglycemia, unspecified: Secondary | ICD-10-CM | POA: Diagnosis not present

## 2018-04-04 DIAGNOSIS — I6529 Occlusion and stenosis of unspecified carotid artery: Secondary | ICD-10-CM | POA: Diagnosis not present

## 2018-04-04 DIAGNOSIS — I10 Essential (primary) hypertension: Secondary | ICD-10-CM

## 2018-04-04 NOTE — Progress Notes (Signed)
Patient ID: Diana Wells MRN: [id number], DOB: Jun 19, 1951, 67 y.o. Date of Encounter: @DATE @  Chief Complaint:  No chief complaint on file.   HPI: 67 y.o. year old white female  presents with above.    She saw me as a new pt to Establish Care with our office 03/12/2016.  At that OV she reported that she just saw Neurology on Friday and "He released her and said to f/u with PCP" She was to f/u with PCP to keep BP controlled.   I reviewed Neuro OV note from 03/06/16 -- it stated "recommend maintain blood pressure goal around 130/80".  At prior visits she has relayed to me that she gets very worried about things--like making sure her BP is just right. But also has told me that since her stroke she has hard time keeping information correct and with being able to correctly express/ voice her thoughts.  She also has informed me that she has "PTSD--it is okay as long as she can exercise" and adds that she also has problems with claustrophobia. 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  Other pertinent information is from Neurology note dated 03/06/16. That note does state that she can follow-up with them PRN. Their note includes:  " CT of the brain 07/17/2014 Hypodensities within the left basal ganglia and within the left occipital lobe which can be seen with age-indeterminate/likely subacute infarcts. There is mild expansion of the left basal ganglia with associated mass effect. As these represent different vascular distributions (MCA and PCA) recommend further evaluation with MRI/MRA.  MRI of the brain 07/18/2014 1. Multi focal acute ischemic left MCA territory ischemic infarcts involving the left basal ganglia and cortical and  subcortical white matter of the left parietal and temporal lobes. No significant mass effect or evidence of hemorrhagic conversion. 2. Moderate chronic small vessel ischemic changes involving the supratentorial white matter and pons.  MRA of the brain 07/18/2014 1. 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 2. 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 3. Moderate multi focal atherosclerotic irregularity within the cavernous segments of the internal carotid arteries bilaterally and left vertebral artery.  CT Angio Neck 07/18/2014 1. Complex highly ulcerated plaque at the left carotid bifurcation. Short-segment proximal left ICA stenosis of up to 70 -75%. See sagittal image 134 of series 404. 2. No other hemodynamically significant stenosis in the neck. Great vessel origin and right carotid bifurcation plaque. 3. Dominant dolichoectatic left vertebral artery. 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 4. Non dominant right vertebral artery with soft and calcified plaque intracranially resulting in high-grade stenosis just proximal to the right PICA origin, and then right vertebral artery occlusion distal to the PICA.  Carotid Doppler Bilateral: Vertebral artery flow is antegrade. Right = 1-39% ICA stenosis. Left = 60-79% ICA stenosis.  2D Echocardiogram EF 55-60% with no source of embolus.  CXR 07/18/2014 No edema or consolidation. Atherosclerotic change with areas of calcification in the aorta and left carotid artery.  EKG normal sinus rhythm, nonspecific ST and T waves changes  CUS  02/2015 - right < 40% and left patent.   Assessment: As you may recall, she is a 67 y.o. Caucasian female with PMH of HTN, smoking, and anxiety, was admitted on 07/17/2014 for left MCA stroke, found to have left ICA stenosis 80%. Put on ASA and plavix as  well as lipitor. Had LICA CEA by Dr. Myra Gianotti on 08/06/14. Also followed with Dr. Conchita Paris for left VA fusiform aneurysm which does not need intervention at this time. She is on aspirin and Lipitor now. She complained of transient blurry vision after taking HCTZ, and also dry cough which likely to be side effect from lisinopril. So her HCTZ and lisinopril were stopped and changed to novasc and diovan, so far BP controlled well at home. Speech much improved. Repeat CUS 02/2015 normal. Doing well during the interval time.   Plan:  - continue ASA and lipitor for stroke prevention  - continue amlodipine and diovan for BP control and check BP at home and record - Follow up with your primary care physician for stroke risk factor modification. Recommend maintain blood pressure goal around 130/80, diabetes with hemoglobin A1c goal below 6.5% and lipids with LDL cholesterol goal below 70 mg/dL.  - continue abstain from smoking - continue to follow up with vascular surgery - target HR on exercise 120-130, max 155 - speech exercise at home - follow up as needed  No orders of the defined types were placed in this encounter.   Patient Instructions  - continue ASA and lipitor for stroke prevention  - continue amlodipine and diovan for BP control and check BP at home and record - Follow up with your primary care physician for stroke risk factor modification. Recommend maintain blood pressure goal around 130/80, diabetes with hemoglobin A1c goal below 6.5% and lipids with LDL cholesterol goal below 70 mg/dL.  - continue abstain from smoking - continue to follow up with vascular surgery - target HR on exercise 120-130, max 155 - speech exercise at home - follow up as needed       At prior visit I asked whether she would want to do complete physical exam and preventive care. She stated that she does not want to do any preventive care. She stated that she only wants to treat these current problems  and does not want to do any further evaluation.  At initial OV here, lab reveled high glucose. A1C was added and was slightly elevated.  Low Carbohydrate diet informations was then reviewed, given to her.   At OV 06/2016--- Then she talked about the fact of how the stroke has affected her brain. Said often she cannot get out the words correctly.  York Spaniel  also she will flip numbers and say them in reverse order----" you can say 6.3 and I will then say 3.6" Said that she has a real problem with that unless she can see things in writing. Said that she also has difficulty processing information and even when she can see it in writing still has to keep looking at it and has difficulty processing information. York Spaniel this has happened regarding the low carbohydrate information I reviewed and gave her at last office visit. Also said that after her doctor's appointments, her daughter will ask her how the visit went and she can't think of anything to say at all. York Spaniel  that her daughter lives in [place] and works as a Chief Executive Officer. Also said that she has a friend who is an Charity fundraiser. At that visit I printed  her last set of lab results and her last office visit and she was going to take these home and schedule a time for her daughter and her RN friend and herself to all get together and review this information and help her understand and process this and help her in regards to shopping and preparing foods.  10/01/2016: She has been following low Carb information. She has lost more weight and is excited about this.  She is taking BP meds as directed. No lightheadedness or other adverse effects.  She is taking Lipitor as directed. No myalgias or other adverse effects.  She has no complaints or concerns today.   03/31/2017: Today she says she has been doing really well with her diet and exercise, "but is stuck at 149 lb" -- No matter what she does, cannot go < 149. At end of visit, asks if she can ask me  something---wants to talk to a therapist--but doesn't want anyone to know--confidential--her daughter doesn't even know---I gave her contact info for 2 therapists---told her to check with her insurance to find out if insurance covers certain ones.  Says she does have rash on the palmar aspect at the base of her hand. Says that she has changed to a different dish soap etc. Has applied lotion that is the only treatment she has tried. Otherwise she has been doing well. She is taking BP meds as directed. No lightheadedness or other adverse effects.  She is taking Lipitor as directed. No myalgias or other adverse effects.    09/30/2017: Today she reports that she's been doing very well. Reports that she realized that she needed to stop her bread and her sugar. Had not realized how many carbohydrates are in one piece of bread. Regarding the sugar says that the big thing was sweet tea. Has always liked sweet tea. Has changed to getting unsweet tea and adding Stevia. Continues to walk 2-3 miles every day. Asked if she ended up following up with therapist after her last visit with me. Was interested to have her feedback regarding the therapist. However she states that she ended up not having to go to a therapist. 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 She states that this is her therapy. Says that it gives her purpose and it gives her something positive to do. States that when she has had her blood pressure checked it has been running good with good readings. She is taking her blood pressure medicines as directed. She is having no lightheadedness, no lower extremity edema, no other adverse effects. She is taking Lipitor as directed. No myalgias or other adverse effects. She recently had follow-up visit with vascular and had follow-up carotid artery Doppler which was good.Left carotid endarterectomy site  remains patent. Right carotid stenosis less than 40%. Was no significant change compared to study from 08/2016. She is in very good spirits today and is smiling and happy through the visit.   04/04/2018:  Today she states that she has been doing very well. She states that her blood pressure is usually reading 118/65. She reports that she is walking 3 miles 5 days a week for a total of 15 miles a week. 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  Says that she "just took 11 bears to the fire department and they just loved them" She  has no specific concerns to address today. She is taking her blood pressure medicines as directed. She is having no lightheadedness, no lower extremity edema, no other adverse effects. She is taking Lipitor as directed. No myalgias or other adverse effects. She recently had follow-up visit with vascular and had follow-up carotid artery Doppler which was good.Left carotid endarterectomy site remains patent. Right carotid stenosis less than 40%. Was no significant change compared to study from 08/2016. She is in very good spirits today and is smiling and happy through the visit.   Past Medical History:  Diagnosis Date  . Allergy   . Anxiety   . Aphasia    slight aphasia  . Depression   . GERD (gastroesophageal reflux disease)   . Hyperlipidemia   . Hypertension   . Peripheral vascular disease (HCC)    ? in arm lft  . Stroke (HCC)    8/15 earlier in month  . Vertebral artery aneurysm (HCC)    left VA aneursym by 07/2014 MRA (further eval by Dr. Conchita ParisNundkumar following L CEA surgery)     Home Meds: Outpatient Medications Prior to Visit  Medication Sig Dispense Refill  . amLODipine (NORVASC) 10 MG tablet Take 1 tablet by mouth daily.  0  . amLODipine (NORVASC) 10 MG tablet take 1 tablet by mouth once daily for high blood pressure 90 tablet 1  . aspirin EC 81 MG EC tablet Take 1 tablet (81 mg total) by mouth  daily. 30 tablet 0  . atorvastatin (LIPITOR) 80 MG tablet take 1 tablet by mouth once daily AT 6 PM 90 tablet 1  . loratadine (CLARITIN) 10 MG tablet Take 10 mg by mouth daily as needed for allergies.    Marland Kitchen. losartan (COZAAR) 50 MG tablet take 1 tablet once daily 90 tablet 1   No facility-administered medications prior to visit.     Allergies:  Allergies  Allergen Reactions  . Codeine Other (See Comments)    Makes patient incoherent and involuntarily perform acts that she does not remember.   . Erythromycin Itching and Rash    Hallucinations  . Penicillins Itching and Rash    Hallucinations   . Eggs Or Egg-Derived Products     Per pt "severely allergic" gets really sick  . Lisinopril Cough  . Ppd [Tuberculin Purified Protein Derivative] Hives  . Dopamine Hcl Nausea Only and Rash    Social History   Socioeconomic History  . Marital status: Divorced    Spouse name: Not on file  . Number of children: 1  . Years of education: college  . Highest education level: Not on file  Occupational History  . Occupation: retired  Engineer, productionocial Needs  . Financial resource strain: Not on file  . Food insecurity:    Worry: Not on file    Inability: Not on file  . Transportation needs:    Medical: Not on file    Non-medical: Not on file  Tobacco Use  . Smoking status: Former Smoker    Packs/day: 0.25    Years: 47.00    Pack years: 11.75    Types: Cigarettes    Last attempt to quit: 07/19/2014    Years since quitting: 3.7  . Smokeless tobacco: Never Used  Substance and Sexual Activity  . Alcohol use: No    Alcohol/week: 0.0 oz  . Drug use: No  . Sexual activity: Not Currently  Lifestyle  . Physical activity:    Days per week: Not on file  Minutes per session: Not on file  . Stress: Not on file  Relationships  . Social connections:    Talks on phone: Not on file    Gets together: Not on file    Attends religious service: Not on file    Active member of club or organization: Not on  file    Attends meetings of clubs or organizations: Not on file    Relationship status: Not on file  . Intimate partner violence:    Fear of current or ex partner: Not on file    Emotionally abused: Not on file    Physically abused: Not on file    Forced sexual activity: Not on file  Other Topics Concern  . Not on file  Social History Narrative   Patient is divorced with one child.   Patient is right handed.   Patient has college education.   Patient drinks 2 cups daily.    Family History  Problem Relation Age of Onset  . Heart failure Mother   . Heart disease Mother   . Kidney disease Mother   . Diabetes Mother   . Hypertension Maternal Grandfather   . Stroke Maternal Grandfather   . Heart attack Neg Hx   . Diabetes Father      Review of Systems:  See HPI for pertinent ROS. All other ROS negative.    Physical Exam: Blood pressure (!) 142/80, pulse 88, temperature 98.1 F (36.7 C), temperature source Oral, resp. rate 16, height 5' (1.524 m), weight 64.5 kg (142 lb 3.2 oz), SpO2 98 %., Body mass index is 27.77 kg/m. General: WNWD WF. Appears in no acute distress. Neck: Supple. No thyromegaly. No lymphadenopathy. Left CarotidEndoArterectomy Scar. Lungs: Clear bilaterally to auscultation without wheezes, rales, or rhonchi. Breathing is unlabored. Heart: RRR with S1 S2. No murmurs, rubs, or gallops. Abdomen: Soft, non-tender, non-distended with normoactive bowel sounds. No hepatomegaly. No rebound/guarding. No obvious abdominal masses. Musculoskeletal:  Strength and tone normal for age. Extremities/Skin: Warm and dry.  No LE edema.  Neuro: Alert and oriented X 3. Moves all extremities spontaneously. Gait is normal. CNII-XII grossly in tact. Psych:  Responds to questions appropriately with a normal affect. She is very happy and in good spirits today---never became anxious at all during today's visit.     ASSESSMENT AND PLAN:  67 y.o. year old female with    Hyperglycemia At OV 03/23/16 I gave and reviewed low carbohydrate handout. 04/04/2018: She is compliant with low carb diet. She is compliant with exercise. Walks 2-3 miles per day. Has a treadmill.Recheck A1c now  Essential hypertension 04/04/2018: BP at goal.  Continue current medications. Check lab to monitor.  H/O Ischemic stroke (HCC) 04/04/2018:  See HPI for details.   Carotid stenosis, left 04/04/2018: H/O Left carotid endarterectomy.See HPI for details.Managed by Vascular----has had routine f/u OV and Dopplers to f/u, manage this  HLD (hyperlipidemia) 04/04/2018: Goal LDL 70.  Currently on Lipitor 80 mg.  Check lab to monitor.  - COMPLETE METABOLIC PANEL WITH GFR  - Lipid panel  - atorvastatin (LIPITOR) 80 MG tablet; TAKE 1 TABLET BY MOUTH DAILY AT 6:00PM  Dispense: 90 tablet; Refill: 2  Aneurysm, cerebral, nonruptured 04/04/2018: See history of present illness for detail.       She had evaluation by Dr.Nundkumar--decided no surgery needed  Anxiety 04/04/2018: She has h/o significant anxiety.  Even during her initial visit with me 03/12/16 she demonstrates significant anxiety when discussing her PTSD and also when discussing her  blood pressure.  At OV 03/31/2017---demonstrates significant anxiety when discussing talking with a therapist  At OV 09/30/2017----she demonstrates no anxiety throughout the entire visit today. Today she is smiling and happy and content.  Depression 04/04/2018: This is stable and controlled.  Eczema of hands: 04/04/2018: Apply Hydrocortisone Cream       Wear gloves when washing dishes or doing any cleaning   At past visit I did mention whether she would want to do complete physical exam and preventive care. She states that she does not want to do any preventive care. She states that she only wants to treat these current problems and does not want to do any further evaluation.  At OV 09/30/2017---I specifically asked about doing a mammogram. She does  not want to do a mammogram and again says that she does not want to do any additional screenings.  F/U OV 6 months, sooner if needed  Signed, [facility] [place], [place], [facility] 04/04/2018 7:59 AM

## 2018-04-05 LAB — COMPLETE METABOLIC PANEL WITH GFR
AG Ratio: 1.8 (calc) (ref 1.0–2.5)
ALKALINE PHOSPHATASE (APISO): 124 U/L (ref 33–130)
ALT: 20 U/L (ref 6–29)
AST: 21 U/L (ref 10–35)
Albumin: 4.6 g/dL (ref 3.6–5.1)
BUN: 10 mg/dL (ref 7–25)
CALCIUM: 9.5 mg/dL (ref 8.6–10.4)
CO2: 27 mmol/L (ref 20–32)
Chloride: 103 mmol/L (ref 98–110)
Creat: 0.75 mg/dL (ref 0.50–0.99)
GFR, EST AFRICAN AMERICAN: 96 mL/min/{1.73_m2} (ref >=60)
GFR, Est Non African American: 83 mL/min/{1.73_m2} (ref >=60)
Globulin: 2.5 g/dL (calc) (ref 1.9–3.7)
Glucose, Bld: 109 mg/dL — ABNORMAL HIGH (ref 65–99)
POTASSIUM: 4.7 mmol/L (ref 3.5–5.3)
SODIUM: 138 mmol/L (ref 135–146)
Total Bilirubin: 0.7 mg/dL (ref 0.2–1.2)
Total Protein: 7.1 g/dL (ref 6.1–8.1)

## 2018-04-05 LAB — LIPID PANEL
Cholesterol: 127 mg/dL (ref <200)
HDL: 43 mg/dL — ABNORMAL LOW (ref >50)
LDL CHOLESTEROL (CALC): 69 mg/dL
Non-HDL Cholesterol (Calc): 84 mg/dL (calc) (ref <130)
Total CHOL/HDL Ratio: 3 (calc) (ref <5.0)
Triglycerides: 71 mg/dL (ref <150)

## 2018-04-11 ENCOUNTER — Other Ambulatory Visit: Payer: Self-pay | Admitting: Physician Assistant

## 2018-06-06 ENCOUNTER — Other Ambulatory Visit: Payer: Self-pay | Admitting: Physician Assistant

## 2018-06-16 ENCOUNTER — Other Ambulatory Visit: Payer: Self-pay | Admitting: Physician Assistant

## 2018-06-16 DIAGNOSIS — E785 Hyperlipidemia, unspecified: Secondary | ICD-10-CM

## 2018-07-05 ENCOUNTER — Other Ambulatory Visit: Payer: Self-pay | Admitting: Physician Assistant

## 2018-09-05 ENCOUNTER — Other Ambulatory Visit: Payer: Self-pay | Admitting: Physician Assistant

## 2018-09-14 ENCOUNTER — Other Ambulatory Visit: Payer: Self-pay | Admitting: Physician Assistant

## 2018-09-14 DIAGNOSIS — E785 Hyperlipidemia, unspecified: Secondary | ICD-10-CM

## 2018-10-03 ENCOUNTER — Ambulatory Visit: Payer: Medicare HMO | Admitting: Physician Assistant

## 2018-10-04 ENCOUNTER — Other Ambulatory Visit: Payer: Self-pay

## 2018-10-04 ENCOUNTER — Encounter: Payer: Self-pay | Admitting: Family Medicine

## 2018-10-04 ENCOUNTER — Ambulatory Visit (INDEPENDENT_AMBULATORY_CARE_PROVIDER_SITE_OTHER): Payer: Medicare HMO | Admitting: Family Medicine

## 2018-10-04 VITALS — BP 124/70 | HR 78 | Temp 97.9°F | Resp 14 | Ht 61.0 in | Wt 138.0 lb

## 2018-10-04 DIAGNOSIS — E785 Hyperlipidemia, unspecified: Secondary | ICD-10-CM

## 2018-10-04 DIAGNOSIS — R739 Hyperglycemia, unspecified: Secondary | ICD-10-CM

## 2018-10-04 DIAGNOSIS — I10 Essential (primary) hypertension: Secondary | ICD-10-CM

## 2018-10-04 DIAGNOSIS — Z862 Personal history of diseases of the blood and blood-forming organs and certain disorders involving the immune mechanism: Secondary | ICD-10-CM

## 2018-10-04 DIAGNOSIS — R7309 Other abnormal glucose: Secondary | ICD-10-CM | POA: Diagnosis not present

## 2018-10-04 DIAGNOSIS — I639 Cerebral infarction, unspecified: Secondary | ICD-10-CM | POA: Diagnosis not present

## 2018-10-04 NOTE — Progress Notes (Signed)
Patient ID: Diana Wells, female    DOB: 11-02-1951, 67 y.o.   MRN: [id number]  PCP: Dorena Bodo, PA-C  Chief Complaint  Patient presents with  . Follow-up    is fasting    Subjective:   Diana Wells is a 67 y.o. female, presents to clinic with CC of routine follow up for ischemic stroke, HLD, HTN. Patient has been seeing her PCP, Frazier Richards, [place], who unfortunately is no longer working at this clinic, so I have the pleasure of getting to know Diana Wells and doing follow up. In reviewing chart patient typically is seen 2 times a year routinely with her PCP, patient also tells me today that she continues to see vascular surgery and she does have appointment this week with cardiology.  HTN: Patient notes that her blood pressure has continued to be well managed and typically runs 116/56 - 120/72, and sometimes is slightly lower than that.  She is on losartan 50 mg once daily and amlodipine 10 mg once daily.  She denies chest pain, shortness of breath, headaches, near syncopal episodes, lower extremity edema, orthopnea, palpitations, PND.  She denies any hypotensive episodes but she is concerned that after taking her medicines in the morning, usually about 30 to 60 minutes after she takes her morning medications together she will have brief episodes of dizziness which she describes as feeling like she is leaning sideways.  These episodes only last a few seconds.  She has never had a fall or syncopal episode.  She has never checked her blood pressure at the same time is having this dizziness and leaning sensation.    Patient is on Lipitor for hyperlipidemia, atherosclerosis, carotid artery stenosis and past stroke, Lipitor 80 mg once at night, she denies any side effects from this, no myalgias, no abdominal pain, no jaundice.  Reports excellent compliance to both her cholesterol and blood pressure medications.  And is here to do repeated fasting lab work.  She is also taking daily baby aspirin  In  reviewing lab results patient has had elevated fasting glucose multiple times with good A1c, last 5.6 09/30/17 and 5.9 04/01/17.  She also has history of mild anemia in the past without any CBCs done in the past several years.  She is taking baby aspirin daily for CVA secondary prevention, denies any melena or hematochezia denies any symptoms of anemia such as fatigue, rapid heart rate, pallor, shortness of breath, chest pain.   Patient Active Problem List   Diagnosis Date Noted  . Hyperglycemia 03/23/2016  . Anxiety   . Depression   . Essential hypertension 11/15/2014  . Internal carotid artery stenosis 09/05/2014  . HLD (hyperlipidemia) 09/05/2014  . Aneurysm, cerebral, nonruptured 09/05/2014  . Carotid stenosis 08/03/2014  . Stroke (HCC) 07/17/2014  . HTN (hypertension) 07/17/2014  . Ischemic stroke (HCC) 07/17/2014   Current Meds  Medication Sig  . amLODipine (NORVASC) 10 MG tablet Take 1 tablet by mouth daily.  Marland Kitchen aspirin EC 81 MG EC tablet Take 1 tablet (81 mg total) by mouth daily.  Marland Kitchen atorvastatin (LIPITOR) 80 MG tablet TAKE 1 TABLET BY MOUTH ONCE DAILY AT 6 PM  . loratadine (CLARITIN) 10 MG tablet Take 10 mg by mouth daily as needed for allergies.  Marland Kitchen losartan (COZAAR) 50 MG tablet TAKE 1 TABLET BY MOUTH ONCE DAILY    Family medical history, allergies, social history reviewed as documented per chart/EMR   Review of Systems  Constitutional: Negative.  Negative for  activity change, appetite change, chills, diaphoresis, fatigue, fever and unexpected weight change.  HENT: Negative.   Eyes: Negative.   Respiratory: Negative.  Negative for apnea, cough, choking, chest tightness, shortness of breath, wheezing and stridor.   Cardiovascular: Negative.  Negative for chest pain, palpitations and leg swelling.  Gastrointestinal: Negative.  Negative for abdominal distention, abdominal pain, anal bleeding, blood in stool, diarrhea, nausea and vomiting.  Endocrine: Negative.     Genitourinary: Negative.   Musculoskeletal: Negative.   Skin: Negative.  Negative for color change and pallor.  Allergic/Immunologic: Negative.   Neurological: Negative.  Negative for tremors, seizures, syncope, facial asymmetry, weakness, light-headedness, numbness and headaches.  Hematological: Negative.  Does not bruise/bleed easily.  Psychiatric/Behavioral: Negative.   All other systems reviewed and are negative.      Objective:    Vitals:   10/04/18 0810  BP: 124/70  Pulse: 78  Resp: 14  Temp: 97.9 F (36.6 C)  TempSrc: Oral  SpO2: 97%  Weight: 138 lb (62.6 kg)  Height: 5\' 1"  (1.549 m)      Physical Exam  Constitutional: She is oriented to person, place, and time. She appears well-developed and well-nourished.  Non-toxic appearance. No distress.  HENT:  Head: Normocephalic and atraumatic.  Right Ear: External ear normal.  Left Ear: External ear normal.  Nose: Nose normal.  Mouth/Throat: Uvula is midline, oropharynx is clear and moist and mucous membranes are normal.  Eyes: Pupils are equal, round, and reactive to light. Conjunctivae, EOM and lids are normal. No scleral icterus.  Neck: Normal range of motion and phonation normal. Neck supple. No tracheal deviation present. No thyromegaly present.  Cardiovascular: Normal rate, regular rhythm, normal heart sounds, intact distal pulses and normal pulses. Exam reveals no gallop and no friction rub.  No murmur heard. Pulses:      Radial pulses are 2+ on the right side, and 2+ on the left side.       Posterior tibial pulses are 2+ on the right side, and 2+ on the left side.  Pulmonary/Chest: Effort normal and breath sounds normal. No stridor. No respiratory distress. She has no wheezes. She has no rhonchi. She has no rales. She exhibits no tenderness.  Abdominal: Soft. Normal appearance and bowel sounds are normal. She exhibits no distension. There is no tenderness. There is no rebound and no guarding.  Musculoskeletal:  Normal range of motion. She exhibits no edema or deformity.  Lymphadenopathy:    She has no cervical adenopathy.  Neurological: She is alert and oriented to person, place, and time. She exhibits normal muscle tone. Coordination and gait normal.  Skin: Skin is warm, dry and intact. Capillary refill takes less than 2 seconds. No rash noted. She is not diaphoretic. No pallor.  Psychiatric: She has a normal mood and affect. Her speech is normal and behavior is normal. Judgment and thought content normal.  Nursing note and vitals reviewed.         Assessment & Plan:   67 year old white female presents for recheck of multiple chronic conditions also has a past medical history of ischemic stroke, internal carotid stenosis, recheck of her hypertension hyperlipidemia today.  Blood pressure at goal, see below for further details.  Dyslipidemia/hyperlipidemia, patient has done well with high-dose statin, does have slightly low high density lipids, reviewed and discussed diet for optimizing cholesterol levels, patient has been exercising and eating very healthy, she was encouraged to continue these healthy habits patient does have upcoming appointment with cardiology and she  does follow with vascular surgery.  Problem List Items Addressed This Visit      Cardiovascular and Mediastinum   Essential hypertension    Well controlled, today at goal Excellent compliance with medications Possible side effect with taking both medications at once, will continue losartan 50 mg every morning and take Norvasc 10 mg daily at bedtime, monitor blood pressures, follow-up if low or symptoms continue Recheck Renal function and electrolytes F/up 6 months      Relevant Orders   COMPLETE METABOLIC PANEL WITH GFR (Completed)     Other   HLD (hyperlipidemia) - Primary    Patient tolerating Lipitor 80 mg without side effects, total cholesterol and LDL have been well controlled with this, high dose statin and baby aspirin  for history of stroke Obtain fasting lipid panel again today F/up 6 months Peripheral vascular disease and carotid artery disease followed by vascular surgery      Relevant Orders   COMPLETE METABOLIC PANEL WITH GFR (Completed)   Lipid Panel (Completed)   Hyperglycemia    Fasting CMP today, will add A1c if high      Relevant Orders   COMPLETE METABOLIC PANEL WITH GFR (Completed)    Other Visit Diagnoses    History of anemia       Relevant Orders   CBC with Differential (Completed)          Danelle Berry, PA-C 10/04/18 8:33 AM

## 2018-10-06 ENCOUNTER — Ambulatory Visit: Payer: Medicare HMO | Admitting: Family

## 2018-10-06 ENCOUNTER — Ambulatory Visit (HOSPITAL_COMMUNITY)
Admission: RE | Admit: 2018-10-06 | Discharge: 2018-10-06 | Disposition: A | Discharge status: Home / Self Care | Payer: Medicare HMO | Source: Ambulatory Visit | Attending: Family | Admitting: Family

## 2018-10-06 ENCOUNTER — Encounter: Payer: Self-pay | Admitting: Family

## 2018-10-06 ENCOUNTER — Encounter: Payer: Self-pay | Admitting: Family Medicine

## 2018-10-06 VITALS — BP 130/75 | HR 70 | Temp 97.4°F | Resp 16 | Ht 60.0 in | Wt 141.1 lb

## 2018-10-06 DIAGNOSIS — Z9889 Other specified postprocedural states: Secondary | ICD-10-CM | POA: Diagnosis not present

## 2018-10-06 DIAGNOSIS — I6522 Occlusion and stenosis of left carotid artery: Secondary | ICD-10-CM | POA: Insufficient documentation

## 2018-10-06 NOTE — Patient Instructions (Signed)

## 2018-10-06 NOTE — Assessment & Plan Note (Signed)
Patient tolerating Lipitor 80 mg without side effects, total cholesterol and LDL have been well controlled with this, high dose statin and baby aspirin for history of stroke Obtain fasting lipid panel again today F/up 6 months Peripheral vascular disease and carotid artery disease followed by vascular surgery

## 2018-10-06 NOTE — Assessment & Plan Note (Signed)
Well controlled, today at goal Excellent compliance with medications Possible side effect with taking both medications at once, will continue losartan 50 mg every morning and take Norvasc 10 mg daily at bedtime, monitor blood pressures, follow-up if low or symptoms continue Recheck Renal function and electrolytes F/up 6 months

## 2018-10-06 NOTE — Progress Notes (Signed)
Chief Complaint: Follow up Extracranial Carotid Artery Stenosis   History of Present Illness  Diana Wells is a 67 y.o. female who is status post left carotid endarterectomy on 08/06/2014 by Dr. Myra Gianotti.  This was done for symptomatic left carotid stenosis. Prior to her operation, she had suffered a left brain stroke which left her with symptoms of expressive aphasia. She states that her symptoms have improved significantly. She denies any subsequent stroke or TIA.    Her diet is mostly plant based, few carbs, some meat.    She denies claudication sx's with walking. She walks 20 miles/week on her treadmill.   Diabetic: yes, 5.6 A1C on 09-30-17 (review of records) Tobacco use: former smoker, quit in 2015, smoked x 47 years   Pt meds include: Statin : yes ASA: yes Other anticoagulants/antiplatelets: no    Past Medical History:  Diagnosis Date  . Allergy   . Anxiety   . Aphasia    slight aphasia  . Depression   . GERD (gastroesophageal reflux disease)   . Hyperlipidemia   . Hypertension   . Peripheral vascular disease (HCC)    ? in arm lft  . Stroke (HCC)    8/15 earlier in month  . Vertebral artery aneurysm (HCC)    left [facility] aneursym by 07/2014 [facility] (further eval by Dr. Conchita Paris following L CEA surgery)    Social History Social History   Tobacco Use  . Smoking status: Former Smoker    Packs/day: 0.25    Years: 47.00    Pack years: 11.75    Types: Cigarettes    Last attempt to quit: 07/19/2014    Years since quitting: 4.2  . Smokeless tobacco: Never Used  Substance Use Topics  . Alcohol use: No    Alcohol/week: 0.0 standard drinks  . Drug use: No    Family History Family History  Problem Relation Age of Onset  . Heart failure Mother   . Heart disease Mother   . Kidney disease Mother   . Diabetes Mother   . Hypertension Maternal Grandfather   . Stroke Maternal Grandfather   . Diabetes Father   . Heart attack Neg Hx     Surgical History Past  Surgical History:  Procedure Laterality Date  . ENDARTERECTOMY Left 08/03/2014   Procedure: LEFT CAROTID ENDARTERECTOMY  WITH PATCH ANGIOPLASTY;  Surgeon: Nada Libman, MD;  Location: [facility] OR;  Service: Vascular;  Laterality: Left;    Allergies  Allergen Reactions  . Codeine Other (See Comments)    Makes patient incoherent and involuntarily perform acts that she does not remember.   . Erythromycin Itching and Rash    Hallucinations  . Penicillins Itching and Rash    Hallucinations   . Eggs Or Egg-Derived Products     Per pt "severely allergic" gets really sick  . Lisinopril Cough  . Ppd [Tuberculin Purified Protein Derivative] Hives  . Dopamine Hcl Nausea Only and Rash    Current Outpatient Medications  Medication Sig Dispense Refill  . amLODipine (NORVASC) 10 MG tablet Take 1 tablet by mouth at bedtime.   0  . aspirin EC 81 MG EC tablet Take 1 tablet (81 mg total) by mouth daily. 30 tablet 0  . atorvastatin (LIPITOR) 80 MG tablet TAKE 1 TABLET BY MOUTH ONCE DAILY AT 6 PM 90 tablet 0  . loratadine (CLARITIN) 10 MG tablet Take 10 mg by mouth daily as needed for allergies.    Marland Kitchen losartan (COZAAR) 50 MG tablet TAKE  1 TABLET BY MOUTH ONCE DAILY 90 tablet 0   No current facility-administered medications for this visit.     Review of Systems : See HPI for pertinent positives and negatives.  Physical Examination  Vitals:   10/06/18 0833 10/06/18 0835  BP: 135/71 130/75  Pulse: 68 70  Resp: 16   Temp: (!) 97.4 F (36.3 C)   TempSrc: Oral   SpO2: 97% 97%  Weight: 141 lb 1.6 oz (64 kg)   Height: 5' (1.524 m)    Body mass index is 27.56 kg/m.  General: WDWN female in NAD GAIT: normal Eyes: PERRLA HENT: No gross abnormalities.  Pulmonary:  Respirations are non-labored, good air movement in all fields, CTAB, no rales, rhonchi, or wheezes. Cardiac: regular rhythm and rate, no detected murmur.  VASCULAR EXAM Carotid Bruits Right Left   Positive Negative     Abdominal  aortic pulse is not palpable. Radial pulses are 2+ palpable and equal.                                                                                                                            LE Pulses Right Left       POPLITEAL  not palpable   not palpable       POSTERIOR TIBIAL  not palpable   not palpable        DORSALIS PEDIS      ANTERIOR TIBIAL 2+ palpable  2+ palpable     Gastrointestinal: soft, nontender, BS WNL, no r/g, no palpable masses. Musculoskeletal: no muscle atrophy/wasting. M/S 5/5 throughout, extremities without ischemic changes. Skin: No rashes, no ulcers, no cellulitis.   Neurologic:  A&O X 3; appropriate affect, sensation is normal and equal bilaterally; speech is normal, CN 2-12 intact, pain and light touch intact in extremities, motor exam as listed above. Psychiatric: Normal thought content, mood appropriate to clinical situation.     Assessment: Diana Wells is a 67 y.o. female who is status post left carotid endarterectomy on 08/06/2014. This was done for symptomatic left carotid stenosis. Prior to her operation, she had suffered a left brain stroke which left her with symptoms of expressive aphasia.   She states that her symptoms have completely resolved with no residual neurological deficits. She denies any subsequent stroke or TIA.   She is diligent about lifestyle modification.    DATA Carotid Duplex (10-06-18): Right ICA: 1-39% stenosis Left ICA: CEA site with no stenosis Bilateral vertebral artery flow is antegrade.  Bilateral subclavian artery waveforms are normal.  No change compared to the exams on 02-25-15, 9-6-1, and 08-20-17.    Plan: Follow-up in 18 months with Carotid Duplex scan.   Continue excellent lifestyle measures.    I discussed in depth with the patient the nature of atherosclerosis, and emphasized the importance of maximal medical management including strict control of blood pressure, blood glucose, and lipid levels,  obtaining regular exercise, and continued cessation of smoking.  The patient is  aware that without maximal medical management the underlying atherosclerotic disease process will progress, limiting the benefit of any interventions. The patient was given information about stroke prevention and what symptoms should prompt the patient to seek immediate medical care. Thank you for allowing [place] to participate in this patient's care.  Charisse March, RN, MSN, FNP-C [facility] Office: [phone]  Clinic Physician: Darrick Penna  10/06/18 8:37 AM

## 2018-10-06 NOTE — Assessment & Plan Note (Signed)
Fasting CMP today, will add A1c if high

## 2018-10-07 ENCOUNTER — Encounter: Payer: Self-pay | Admitting: *Deleted

## 2018-10-07 DIAGNOSIS — Z862 Personal history of diseases of the blood and blood-forming organs and certain disorders involving the immune mechanism: Secondary | ICD-10-CM

## 2018-10-07 LAB — COMPLETE METABOLIC PANEL WITH GFR
AG Ratio: 2.1 (calc) (ref 1.0–2.5)
ALBUMIN MSPROF: 4.8 g/dL (ref 3.6–5.1)
ALT: 23 U/L (ref 6–29)
AST: 23 U/L (ref 10–35)
Alkaline phosphatase (APISO): 122 U/L (ref 33–130)
BILIRUBIN TOTAL: 0.7 mg/dL (ref 0.2–1.2)
BUN: 8 mg/dL (ref 7–25)
CALCIUM: 9.6 mg/dL (ref 8.6–10.4)
CHLORIDE: 104 mmol/L (ref 98–110)
CO2: 25 mmol/L (ref 20–32)
CREATININE: 0.64 mg/dL (ref 0.50–0.99)
GFR, EST AFRICAN AMERICAN: 107 mL/min/{1.73_m2} (ref >=60)
GFR, EST NON AFRICAN AMERICAN: 92 mL/min/{1.73_m2} (ref >=60)
GLUCOSE: 116 mg/dL — AB (ref 65–99)
Globulin: 2.3 g/dL (calc) (ref 1.9–3.7)
Potassium: 4.8 mmol/L (ref 3.5–5.3)
Sodium: 138 mmol/L (ref 135–146)
TOTAL PROTEIN: 7.1 g/dL (ref 6.1–8.1)

## 2018-10-07 LAB — LIPID PANEL
CHOL/HDL RATIO: 3.3 (calc) (ref <5.0)
Cholesterol: 133 mg/dL (ref <200)
HDL: 40 mg/dL — ABNORMAL LOW (ref >50)
LDL CHOLESTEROL (CALC): 72 mg/dL
Non-HDL Cholesterol (Calc): 93 mg/dL (calc) (ref <130)
Triglycerides: 120 mg/dL (ref <150)

## 2018-10-07 LAB — CBC WITH DIFFERENTIAL/PLATELET
BASOS PCT: 0.5 %
Basophils Absolute: 65 cells/uL (ref 0–200)
Eosinophils Absolute: 234 cells/uL (ref 15–500)
Eosinophils Relative: 1.8 %
HCT: 42.6 % (ref 35.0–45.0)
Hemoglobin: 14.5 g/dL (ref 11.7–15.5)
Lymphs Abs: 2171 cells/uL (ref 850–3900)
MCH: 29.9 pg (ref 27.0–33.0)
MCHC: 34 g/dL (ref 32.0–36.0)
MCV: 87.8 fL (ref 80.0–100.0)
MONOS PCT: 7.2 %
MPV: 9.9 fL (ref 7.5–12.5)
NEUTROS PCT: 73.8 %
Neutro Abs: 9594 cells/uL — ABNORMAL HIGH (ref 1500–7800)
Platelets: 284 10*3/uL (ref 140–400)
RBC: 4.85 10*6/uL (ref 3.80–5.10)
RDW: 12.9 % (ref 11.0–15.0)
TOTAL LYMPHOCYTE: 16.7 %
WBC: 13 10*3/uL — AB (ref 3.8–10.8)
WBCMIX: 936 {cells}/uL (ref 200–950)

## 2018-10-07 LAB — TEST AUTHORIZATION

## 2018-10-07 LAB — HEMOGLOBIN A1C W/OUT EAG: HEMOGLOBIN A1C: 5.5 %{Hb} (ref <5.7)

## 2018-12-13 ENCOUNTER — Other Ambulatory Visit: Payer: Self-pay

## 2018-12-13 DIAGNOSIS — E785 Hyperlipidemia, unspecified: Secondary | ICD-10-CM

## 2018-12-13 MED ORDER — LOSARTAN POTASSIUM 50 MG PO TABS: 50.0000 mg | ORAL_TABLET | Freq: Every day | ORAL | 0 refills | Status: DC

## 2018-12-13 MED ORDER — ATORVASTATIN CALCIUM 80 MG PO TABS: ORAL_TABLET | ORAL | 0 refills | Status: DC

## 2019-02-16 ENCOUNTER — Encounter: Payer: Self-pay | Admitting: Family Medicine

## 2019-02-16 ENCOUNTER — Ambulatory Visit (INDEPENDENT_AMBULATORY_CARE_PROVIDER_SITE_OTHER): Payer: Medicare HMO | Admitting: Family Medicine

## 2019-02-16 VITALS — BP 144/80 | HR 74 | Temp 97.7°F | Resp 14 | Ht 61.0 in | Wt 138.1 lb

## 2019-02-16 DIAGNOSIS — E785 Hyperlipidemia, unspecified: Secondary | ICD-10-CM

## 2019-02-16 DIAGNOSIS — R739 Hyperglycemia, unspecified: Secondary | ICD-10-CM | POA: Diagnosis not present

## 2019-02-16 DIAGNOSIS — I10 Essential (primary) hypertension: Secondary | ICD-10-CM

## 2019-02-16 DIAGNOSIS — I6529 Occlusion and stenosis of unspecified carotid artery: Secondary | ICD-10-CM

## 2019-02-16 DIAGNOSIS — Z862 Personal history of diseases of the blood and blood-forming organs and certain disorders involving the immune mechanism: Secondary | ICD-10-CM | POA: Diagnosis not present

## 2019-02-16 DIAGNOSIS — I639 Cerebral infarction, unspecified: Secondary | ICD-10-CM

## 2019-02-16 MED ORDER — AMLODIPINE BESYLATE 10 MG PO TABS: 10.0000 mg | ORAL_TABLET | Freq: Every day | ORAL | 0 refills | Status: DC

## 2019-02-16 NOTE — Progress Notes (Signed)
Patient ID: Diana Wells, female    DOB: Jan 03, 1951, 68 y.o.   MRN: [id number]  PCP: Danelle Berry, PA-C  Chief Complaint  Patient presents with  . Hyperlipidemia    In for 6 month follow up.Is fasting     Subjective:   Diana Wells is a 68 y.o. female, presents to clinic with CC of HLD, HTN, hx of ischemic stroke , carotid artery stenosis and cerebral aneurysm here for labs/f/up.  Blood sugars - high with what she eats the night before, not on any meds.  HLD/CVA, she is compliant with her statin medication, Lipitor 80 mg, she denies any side effects no myalgias, fatigue, weakness.  She does work to have a healthy diet and exercise frequently including going to the gym, running and walking most days of the week.  HTN -she is taking losartan 50 mg and Norvasc daily.  She was taking Norvasc at night and was causing her to wake up with urinary frequency so she switched that to the morning.  She notes that sometimes she will have low blood pressure and she needs to sit down for a minute if feeling lightheaded.  She does get anxious when at the Drs office, BP a little high today.  She monitors at home, remembers last reading to be 110/68 - it is usually around 110/70.  Per chart review - Neurology BP goal - maintain around 130/80. CVA 2015, ASA and lipitor for stroke prevention.  She continues to take ASA, no melena, hematochezia, fatigue, CP, exertional SOB, cold intolerance.  She follows with vascular for carotid artery stenosis - last visit 10/06/2018 - reviewed.   She says today that she has anxiety, per chart has mentioned PMHx of PTSD, not on any meds, she uses exercise to deal with stress and anxiety.    Patient Active Problem List   Diagnosis Date Noted  . Hyperglycemia 03/23/2016  . Anxiety   . Depression   . Essential hypertension 11/15/2014  . Internal carotid artery stenosis 09/05/2014  . HLD (hyperlipidemia) 09/05/2014  . Aneurysm, cerebral, nonruptured 09/05/2014  .  Carotid stenosis 08/03/2014  . Stroke (HCC) 07/17/2014  . HTN (hypertension) 07/17/2014  . Ischemic stroke (HCC) 07/17/2014    Current Meds  Medication Sig  . amLODipine (NORVASC) 10 MG tablet Take 1 tablet by mouth at bedtime.   Marland Kitchen aspirin EC 81 MG EC tablet Take 1 tablet (81 mg total) by mouth daily.  Marland Kitchen atorvastatin (LIPITOR) 80 MG tablet TAKE 1 TABLET BY MOUTH ONCE DAILY AT 6 PM  . loratadine (CLARITIN) 10 MG tablet Take 10 mg by mouth daily as needed for allergies.  Marland Kitchen losartan (COZAAR) 50 MG tablet Take 1 tablet (50 mg total) by mouth daily.     Review of Systems  Constitutional: Negative.   HENT: Negative.   Eyes: Negative.   Respiratory: Negative.   Cardiovascular: Negative.   Gastrointestinal: Negative.   Endocrine: Negative.   Genitourinary: Negative.   Musculoskeletal: Negative.   Skin: Negative.   Allergic/Immunologic: Negative.   Neurological: Negative.   Hematological: Negative.   Psychiatric/Behavioral: Negative.   All other systems reviewed and are negative.      Objective:    Vitals:   02/16/19 0834  BP: (!) 144/80  Pulse: 74  Resp: 14  Temp: 97.7 F (36.5 C)  TempSrc: Oral  SpO2: 98%  Weight: 138 lb 2 oz (62.7 kg)  Height: 5\' 1"  (1.549 m)      Physical Exam Vitals signs and  nursing note reviewed.  Constitutional:      General: She is not in acute distress.    Appearance: Normal appearance. She is well-developed. She is not ill-appearing, toxic-appearing or diaphoretic.  HENT:     Head: Normocephalic and atraumatic.     Right Ear: External ear normal.     Left Ear: External ear normal.     Nose: Nose normal. No congestion or rhinorrhea.     Mouth/Throat:     Mouth: Mucous membranes are moist.     Pharynx: Oropharynx is clear. Uvula midline. No oropharyngeal exudate or posterior oropharyngeal erythema.  Eyes:     General: Lids are normal. No scleral icterus.       Right eye: No discharge.        Left eye: No discharge.      Conjunctiva/sclera: Conjunctivae normal.     Pupils: Pupils are equal, round, and reactive to light.  Neck:     Musculoskeletal: Normal range of motion and neck supple.     Trachea: Phonation normal. No tracheal deviation.  Cardiovascular:     Rate and Rhythm: Normal rate and regular rhythm.     Pulses: Normal pulses.          Radial pulses are 2+ on the right side and 2+ on the left side.       Posterior tibial pulses are 2+ on the right side and 2+ on the left side.     Heart sounds: Normal heart sounds. No murmur. No friction rub. No gallop.   Pulmonary:     Effort: Pulmonary effort is normal. No respiratory distress.     Breath sounds: Normal breath sounds. No stridor. No wheezing, rhonchi or rales.  Chest:     Chest wall: No tenderness.  Abdominal:     General: Bowel sounds are normal. There is no distension.     Palpations: Abdomen is soft.     Tenderness: There is no abdominal tenderness. There is no guarding or rebound.  Musculoskeletal: Normal range of motion.        General: No deformity.     Right lower leg: No edema.     Left lower leg: No edema.  Lymphadenopathy:     Cervical: No cervical adenopathy.  Skin:    General: Skin is warm and dry.     Capillary Refill: Capillary refill takes less than 2 seconds.     Coloration: Skin is not jaundiced or pale.     Findings: No rash.  Neurological:     Mental Status: She is alert and oriented to person, place, and time.     Motor: No abnormal muscle tone.     Coordination: Coordination normal.     Gait: Gait normal.  Psychiatric:        Attention and Perception: Attention normal.        Mood and Affect: Mood and affect normal.        Speech: Speech normal.        Behavior: Behavior normal. Behavior is cooperative.        Thought Content: Thought content normal. Thought content does not include homicidal or suicidal ideation.        Cognition and Memory: Cognition normal.        Judgment: Judgment normal.     Comments:  Occasionally she forgets a word, but most speech is fluent, clear           Assessment & Plan:   Pt is a 68  y/o female presents for 6 month f/up on HTN, HLD, has hx of stroke ICA stenosis. Is taking all meds as prescribed, needs refills of most meds and would like to get them all to the same pharmacy and to have refill at the same time - one month of norvasc printed for pt to get then refill 90 d supply sent to pharmacy to hopefully be on same schedule as statin and losartan.    Pt refuses all screening and preventative meds/tests other than managing current dx.  Problem List Items Addressed This Visit      Cardiovascular and Mediastinum   Ischemic stroke (HCC)    Taking ASA and lipitor Check CMP, FLP, A1C      Relevant Orders   CBC with Differential/Platelet (Completed)   COMPLETE METABOLIC PANEL WITH GFR (Completed)   Hemoglobin A1c (Completed)   Lipid panel (Completed)   Internal carotid artery stenosis    Sees vascular - checking labs same as above      Relevant Orders   CBC with Differential/Platelet (Completed)   COMPLETE METABOLIC PANEL WITH GFR (Completed)   Hemoglobin A1c (Completed)   Lipid panel (Completed)   Essential hypertension   Relevant Orders   COMPLETE METABOLIC PANEL WITH GFR (Completed)     Other   HLD (hyperlipidemia) - Primary    Taking meds, healthy diet and exercising No SE Check CMP/FLP Continue lipitor 80 mg      Relevant Orders   CBC with Differential/Platelet (Completed)   COMPLETE METABOLIC PANEL WITH GFR (Completed)   Hemoglobin A1c (Completed)   Lipid panel (Completed)   Hyperglycemia    A1C goal below 6.5% Blood sugar elevated with big meals or sweats the day before, encourage rare indulgence Check fasting chemistry and A1C Continue diet and exercise/lifestyle to manage      Relevant Orders   COMPLETE METABOLIC PANEL WITH GFR (Completed)   Hemoglobin A1c (Completed)    Other Visit Diagnoses    History of anemia        Relevant Orders   CBC with Differential/Platelet (Completed)          Danelle Berry, PA-C 02/16/19 9:00 AM

## 2019-02-17 ENCOUNTER — Other Ambulatory Visit: Payer: Self-pay

## 2019-02-17 ENCOUNTER — Other Ambulatory Visit: Payer: Self-pay | Admitting: Family Medicine

## 2019-02-17 DIAGNOSIS — E785 Hyperlipidemia, unspecified: Secondary | ICD-10-CM

## 2019-02-17 LAB — COMPLETE METABOLIC PANEL WITH GFR
AG Ratio: 1.9 (calc) (ref 1.0–2.5)
ALBUMIN MSPROF: 4.6 g/dL (ref 3.6–5.1)
ALKALINE PHOSPHATASE (APISO): 99 U/L (ref 37–153)
ALT: 20 U/L (ref 6–29)
AST: 20 U/L (ref 10–35)
BUN: 14 mg/dL (ref 7–25)
CALCIUM: 9.5 mg/dL (ref 8.6–10.4)
CO2: 25 mmol/L (ref 20–32)
CREATININE: 0.75 mg/dL (ref 0.50–0.99)
Chloride: 108 mmol/L (ref 98–110)
GFR, EST NON AFRICAN AMERICAN: 82 mL/min/{1.73_m2} (ref >=60)
GFR, Est African American: 96 mL/min/{1.73_m2} (ref >=60)
GLUCOSE: 105 mg/dL — AB (ref 65–99)
Globulin: 2.4 g/dL (calc) (ref 1.9–3.7)
Potassium: 5 mmol/L (ref 3.5–5.3)
Sodium: 141 mmol/L (ref 135–146)
Total Bilirubin: 0.5 mg/dL (ref 0.2–1.2)
Total Protein: 7 g/dL (ref 6.1–8.1)

## 2019-02-17 LAB — CBC WITH DIFFERENTIAL/PLATELET
Absolute Monocytes: 624 cells/uL (ref 200–950)
BASOS ABS: 57 {cells}/uL (ref 0–200)
Basophils Relative: 0.7 %
Eosinophils Absolute: 162 cells/uL (ref 15–500)
Eosinophils Relative: 2 %
HCT: 41.6 % (ref 35.0–45.0)
Hemoglobin: 14 g/dL (ref 11.7–15.5)
Lymphs Abs: 2147 cells/uL (ref 850–3900)
MCH: 30.2 pg (ref 27.0–33.0)
MCHC: 33.7 g/dL (ref 32.0–36.0)
MCV: 89.7 fL (ref 80.0–100.0)
MPV: 10.3 fL (ref 7.5–12.5)
Monocytes Relative: 7.7 %
Neutro Abs: 5111 cells/uL (ref 1500–7800)
Neutrophils Relative %: 63.1 %
Platelets: 267 10*3/uL (ref 140–400)
RBC: 4.64 10*6/uL (ref 3.80–5.10)
RDW: 12.8 % (ref 11.0–15.0)
TOTAL LYMPHOCYTE: 26.5 %
WBC: 8.1 10*3/uL (ref 3.8–10.8)

## 2019-02-17 LAB — HEMOGLOBIN A1C
HEMOGLOBIN A1C: 5.5 %{Hb} (ref <5.7)
MEAN PLASMA GLUCOSE: 111 (calc)
eAG (mmol/L): 6.2 (calc)

## 2019-02-17 LAB — LIPID PANEL
CHOL/HDL RATIO: 2.7 (calc) (ref <5.0)
Cholesterol: 117 mg/dL (ref <200)
HDL: 43 mg/dL — ABNORMAL LOW (ref >=50)
LDL CHOLESTEROL (CALC): 60 mg/dL
Non-HDL Cholesterol (Calc): 74 mg/dL (calc) (ref <130)
TRIGLYCERIDES: 63 mg/dL (ref <150)

## 2019-02-17 MED ORDER — AMLODIPINE BESYLATE 10 MG PO TABS: 10.0000 mg | ORAL_TABLET | Freq: Every day | ORAL | 1 refills | Status: DC

## 2019-02-17 MED ORDER — ATORVASTATIN CALCIUM 80 MG PO TABS: ORAL_TABLET | ORAL | 1 refills | Status: DC

## 2019-02-17 MED ORDER — LOSARTAN POTASSIUM 50 MG PO TABS: 50.0000 mg | ORAL_TABLET | Freq: Every day | ORAL | 1 refills | Status: DC

## 2019-02-21 NOTE — Assessment & Plan Note (Signed)
Taking meds both in am, sometimes feels lightheaded but BP usually around 110/70 when she checks at home.  She states "white coat syndrome" here today, mildly elevated above goal of 130/80 - continue to monitor at home - pt to notify us if >130/80 Check renal function and electrolytes Refill meds and continue losartan 50 mg and norvasc 10 mg F/up 6 months

## 2019-02-21 NOTE — Assessment & Plan Note (Signed)
Taking ASA and lipitor Check CMP, FLP, A1C

## 2019-02-21 NOTE — Assessment & Plan Note (Signed)
Taking meds, healthy diet and exercising No SE Check CMP/FLP Continue lipitor 80 mg

## 2019-02-21 NOTE — Assessment & Plan Note (Signed)
Sees vascular - checking labs same as above

## 2019-02-21 NOTE — Assessment & Plan Note (Addendum)
A1C goal below 6.5% Blood sugar elevated with big meals or sweats the day before, encourage rare indulgence Check fasting chemistry and A1C Continue diet and exercise/lifestyle to manage

## 2019-05-30 ENCOUNTER — Other Ambulatory Visit: Payer: Self-pay | Admitting: Family Medicine

## 2019-06-13 ENCOUNTER — Other Ambulatory Visit: Payer: Self-pay | Admitting: Family Medicine

## 2019-06-13 DIAGNOSIS — E785 Hyperlipidemia, unspecified: Secondary | ICD-10-CM

## 2019-06-30 ENCOUNTER — Other Ambulatory Visit: Payer: Self-pay | Admitting: Family Medicine

## 2019-06-30 DIAGNOSIS — E785 Hyperlipidemia, unspecified: Secondary | ICD-10-CM

## 2019-07-03 ENCOUNTER — Other Ambulatory Visit: Payer: Self-pay | Admitting: Family Medicine

## 2019-07-03 DIAGNOSIS — E785 Hyperlipidemia, unspecified: Secondary | ICD-10-CM

## 2019-07-12 ENCOUNTER — Encounter: Payer: Self-pay | Admitting: Family Medicine

## 2019-08-22 ENCOUNTER — Encounter: Payer: Self-pay | Admitting: Family Medicine

## 2019-08-22 ENCOUNTER — Encounter: Payer: Medicare HMO | Admitting: Family Medicine

## 2019-08-22 ENCOUNTER — Ambulatory Visit (INDEPENDENT_AMBULATORY_CARE_PROVIDER_SITE_OTHER): Payer: Medicare HMO | Admitting: Family Medicine

## 2019-08-22 VITALS — BP 136/78 | HR 70 | Temp 97.6°F | Resp 16 | Ht 61.0 in | Wt 146.0 lb

## 2019-08-22 DIAGNOSIS — I6522 Occlusion and stenosis of left carotid artery: Secondary | ICD-10-CM

## 2019-08-22 DIAGNOSIS — I10 Essential (primary) hypertension: Secondary | ICD-10-CM | POA: Diagnosis not present

## 2019-08-22 DIAGNOSIS — Z8673 Personal history of transient ischemic attack (TIA), and cerebral infarction without residual deficits: Secondary | ICD-10-CM | POA: Diagnosis not present

## 2019-08-22 DIAGNOSIS — E7800 Pure hypercholesterolemia, unspecified: Secondary | ICD-10-CM | POA: Diagnosis not present

## 2019-08-22 NOTE — Progress Notes (Signed)
Acute Office Visit  Subjective:    Patient ID: Diana Wells, female    DOB: June 26, 1951, 68 y.o.   MRN: [id number]  Chief Complaint  Patient presents with  . Hypertension    HPI Patient is in today for follow-up of her hypertension and hyperlipidemia.  The patient has a history of left-sided stroke approximately 4 years ago.  She is currently taking aspirin 81 mg a day and atorvastatin for hyperlipidemia.  Her blood pressure today is well controlled on a combination of losartan and amlodipine.  She denies any chest pain, shortness of breath, dyspnea on exertion.  She denies any myalgias or right upper quadrant pain on atorvastatin.  She is overdue for a mammogram.  She is overdue for colonoscopy.  She is overdue for a flu shot.  We discussed it at length and the patient adamantly refuses all 3.  She refuses a flu shot.  She refuses a mammogram.  She refuses a colonoscopy.  Past Medical History:  Diagnosis Date  . Allergy   . Anxiety   . Aphasia    slight aphasia  . Depression   . GERD (gastroesophageal reflux disease)   . Hyperlipidemia   . Hypertension   . Peripheral vascular disease ([place])    ? in arm lft  . Stroke ([place])    8/15 earlier in month  . Vertebral artery aneurysm (HCC)    left [facility] aneursym by 07/2014 MRA (further eval by Dr. Kathyrn Sheriff following L CEA surgery)    Past Surgical History:  Procedure Laterality Date  . ENDARTERECTOMY Left 08/03/2014   Procedure: LEFT CAROTID ENDARTERECTOMY  WITH PATCH ANGIOPLASTY;  Surgeon: Serafina Mitchell, MD;  Location: [facility] OR;  Service: Vascular;  Laterality: Left;    Family History  Problem Relation Age of Onset  . Heart failure Mother   . Heart disease Mother   . Kidney disease Mother   . Diabetes Mother   . Hypertension Maternal Grandfather   . Stroke Maternal Grandfather   . Diabetes Father   . Heart attack Neg Hx     Social History   Socioeconomic History  . Marital status: Divorced    Spouse name: Not on file  .  Number of children: 1  . Years of education: college  . Highest education level: Not on file  Occupational History  . Occupation: retired  Scientific laboratory technician  . Financial resource strain: Not on file  . Food insecurity    Worry: Not on file    Inability: Not on file  . Transportation needs    Medical: Not on file    Non-medical: Not on file  Tobacco Use  . Smoking status: Former Smoker    Packs/day: 0.25    Years: 47.00    Pack years: 11.75    Types: Cigarettes    Quit date: 07/19/2014    Years since quitting: 5.0  . Smokeless tobacco: Never Used  Substance and Sexual Activity  . Alcohol use: No    Alcohol/week: 0.0 standard drinks  . Drug use: No  . Sexual activity: Not Currently  Lifestyle  . Physical activity    Days per week: Not on file    Minutes per session: Not on file  . Stress: Not on file  Relationships  . Social Herbalist on phone: Not on file    Gets together: Not on file    Attends religious service: Not on file    Active member of club or organization:  Not on file    Attends meetings of clubs or organizations: Not on file    Relationship status: Not on file  . Intimate partner violence    Fear of current or ex partner: Not on file    Emotionally abused: Not on file    Physically abused: Not on file    Forced sexual activity: Not on file  Other Topics Concern  . Not on file  Social History Narrative   Patient is divorced with one child.   Patient is right handed.   Patient has college education.   Patient drinks 2 cups daily.    Outpatient Medications Prior to Visit  Medication Sig Dispense Refill  . amLODipine (NORVASC) 10 MG tablet Take 1 tablet (10 mg total) by mouth at bedtime. 90 tablet 1  . aspirin EC 81 MG EC tablet Take 1 tablet (81 mg total) by mouth daily. 30 tablet 0  . atorvastatin (LIPITOR) 80 MG tablet TAKE 1 TABLET BY MOUTH EVERY DAY AT 6 PM 90 tablet 1  . loratadine (CLARITIN) 10 MG tablet Take 10 mg by mouth daily as needed  for allergies.    Marland Kitchen losartan (COZAAR) 50 MG tablet Take 1 tablet (50 mg total) by mouth daily. 90 tablet 1   No facility-administered medications prior to visit.     Allergies  Allergen Reactions  . Codeine Other (See Comments)    Makes patient incoherent and involuntarily perform acts that she does not remember.   . Erythromycin Itching and Rash    Hallucinations  . Penicillins Itching and Rash    Hallucinations   . Eggs Or Egg-Derived Products     Per pt "severely allergic" gets really sick  . Lisinopril Cough  . Ppd [Tuberculin Purified Protein Derivative] Hives  . Dopamine Hcl Nausea Only and Rash    ROS     Objective:    Physical Exam  Constitutional: She appears well-developed and well-nourished. No distress.  Neck: No JVD present. No thyromegaly present.  Cardiovascular: Normal rate, regular rhythm, normal heart sounds and intact distal pulses. Exam reveals no gallop and no friction rub.  No murmur heard. Pulmonary/Chest: Effort normal and breath sounds normal. No respiratory distress. She has no wheezes. She has no rales. She exhibits no tenderness.  Abdominal: Soft. Bowel sounds are normal. She exhibits no distension. There is no abdominal tenderness. There is no rebound and no guarding.  Musculoskeletal:        General: No edema.  Skin: She is not diaphoretic.    BP 136/78   Pulse 70   Temp 97.6 F (36.4 C) (Oral)   Resp 16   Ht 5\' 1"  (1.549 m)   Wt 146 lb (66.2 kg)   SpO2 97%   BMI 27.59 kg/m  Wt Readings from Last 3 Encounters:  08/22/19 146 lb (66.2 kg)  02/16/19 138 lb 2 oz (62.7 kg)  10/06/18 141 lb 1.6 oz (64 kg)    Health Maintenance Due  Topic Date Due  . Hepatitis C Screening  1951/08/27  . MAMMOGRAM  08/15/2001  . COLONOSCOPY  08/15/2001  . DEXA SCAN  08/15/2016    There are no preventive care reminders to display for this patient.   Lab Results  Component Value Date   TSH 0.621 05/01/2015   Lab Results  Component Value Date    WBC 8.1 02/16/2019   HGB 14.0 02/16/2019   HCT 41.6 02/16/2019   MCV 89.7 02/16/2019   PLT 267 02/16/2019  Lab Results  Component Value Date   NA 141 02/16/2019   K 5.0 02/16/2019   CO2 25 02/16/2019   GLUCOSE 105 (H) 02/16/2019   BUN 14 02/16/2019   CREATININE 0.75 02/16/2019   BILITOT 0.5 02/16/2019   ALKPHOS 104 04/01/2017   AST 20 02/16/2019   ALT 20 02/16/2019   PROT 7.0 02/16/2019   ALBUMIN 4.4 04/01/2017   CALCIUM 9.5 02/16/2019   ANIONGAP 11 08/04/2014   Lab Results  Component Value Date   CHOL 117 02/16/2019   Lab Results  Component Value Date   HDL 43 (L) 02/16/2019   Lab Results  Component Value Date   LDLCALC 60 02/16/2019   Lab Results  Component Value Date   TRIG 63 02/16/2019   Lab Results  Component Value Date   CHOLHDL 2.7 02/16/2019   Lab Results  Component Value Date   HGBA1C 5.5 02/16/2019       Assessment & Plan:   Pure hypercholesterolemia - Plan: CBC with Differential/Platelet, COMPLETE METABOLIC PANEL WITH GFR, Lipid panel  Stenosis of left internal carotid artery  Essential hypertension  History of CVA in adulthood  Physical exam today is normal.  I will check a CMP and a fasting lipid panel.  Goal LDL cholesterol is less than 70.  Patient is compliant using aspirin.  Strongly recommended a flu shot.  Strongly recommended mammogram.  Strongly recommended a colonoscopy.  Patient refuses all 3.  Blood pressure today is well controlled.  Donita BrooksWarren T Anya Murphey, MD

## 2019-08-23 LAB — CBC WITH DIFFERENTIAL/PLATELET
Absolute Monocytes: 680 cells/uL (ref 200–950)
Basophils Absolute: 51 cells/uL (ref 0–200)
Basophils Relative: 0.6 %
Eosinophils Absolute: 119 cells/uL (ref 15–500)
Eosinophils Relative: 1.4 %
HCT: 42.4 % (ref 35.0–45.0)
Hemoglobin: 14.3 g/dL (ref 11.7–15.5)
Lymphs Abs: 2015 cells/uL (ref 850–3900)
MCH: 30.4 pg (ref 27.0–33.0)
MCHC: 33.7 g/dL (ref 32.0–36.0)
MCV: 90 fL (ref 80.0–100.0)
MPV: 10.6 fL (ref 7.5–12.5)
Monocytes Relative: 8 %
Neutro Abs: 5636 cells/uL (ref 1500–7800)
Neutrophils Relative %: 66.3 %
Platelets: 253 10*3/uL (ref 140–400)
RBC: 4.71 10*6/uL (ref 3.80–5.10)
RDW: 12.7 % (ref 11.0–15.0)
Total Lymphocyte: 23.7 %
WBC: 8.5 10*3/uL (ref 3.8–10.8)

## 2019-08-23 LAB — COMPLETE METABOLIC PANEL WITH GFR
AG Ratio: 1.8 (calc) (ref 1.0–2.5)
ALT: 21 U/L (ref 6–29)
AST: 21 U/L (ref 10–35)
Albumin: 4.6 g/dL (ref 3.6–5.1)
Alkaline phosphatase (APISO): 104 U/L (ref 37–153)
BUN: 10 mg/dL (ref 7–25)
CO2: 25 mmol/L (ref 20–32)
Calcium: 9.6 mg/dL (ref 8.6–10.4)
Chloride: 106 mmol/L (ref 98–110)
Creat: 0.69 mg/dL (ref 0.50–0.99)
GFR, Est African American: 104 mL/min/{1.73_m2} (ref >=60)
GFR, Est Non African American: 89 mL/min/{1.73_m2} (ref >=60)
Globulin: 2.5 g/dL (calc) (ref 1.9–3.7)
Glucose, Bld: 118 mg/dL — ABNORMAL HIGH (ref 65–99)
Potassium: 4.8 mmol/L (ref 3.5–5.3)
Sodium: 140 mmol/L (ref 135–146)
Total Bilirubin: 0.6 mg/dL (ref 0.2–1.2)
Total Protein: 7.1 g/dL (ref 6.1–8.1)

## 2019-08-23 LAB — LIPID PANEL
Cholesterol: 132 mg/dL (ref <200)
HDL: 45 mg/dL — ABNORMAL LOW (ref >=50)
LDL Cholesterol (Calc): 72 mg/dL (calc)
Non-HDL Cholesterol (Calc): 87 mg/dL (calc) (ref <130)
Total CHOL/HDL Ratio: 2.9 (calc) (ref <5.0)
Triglycerides: 72 mg/dL (ref <150)

## 2019-09-12 ENCOUNTER — Other Ambulatory Visit: Payer: Self-pay | Admitting: Family Medicine

## 2019-10-02 ENCOUNTER — Other Ambulatory Visit: Payer: Self-pay | Admitting: Family Medicine

## 2019-10-03 NOTE — Telephone Encounter (Signed)
lvm informing pt that Between refilled the medication but also need to know if she is going to be a patient here at [facility] or if she is going to stay at her facility

## 2019-12-12 DIAGNOSIS — Z0100 Encounter for examination of eyes and vision without abnormal findings: Secondary | ICD-10-CM | POA: Diagnosis not present

## 2019-12-12 DIAGNOSIS — H524 Presbyopia: Secondary | ICD-10-CM | POA: Diagnosis not present

## 2019-12-27 ENCOUNTER — Other Ambulatory Visit: Payer: Self-pay | Admitting: Family Medicine

## 2019-12-27 DIAGNOSIS — E785 Hyperlipidemia, unspecified: Secondary | ICD-10-CM

## 2020-01-24 DIAGNOSIS — F419 Anxiety disorder, unspecified: Secondary | ICD-10-CM | POA: Diagnosis not present

## 2020-01-24 DIAGNOSIS — E785 Hyperlipidemia, unspecified: Secondary | ICD-10-CM | POA: Diagnosis not present

## 2020-01-24 DIAGNOSIS — Z0189 Encounter for other specified special examinations: Secondary | ICD-10-CM | POA: Diagnosis not present

## 2020-01-24 DIAGNOSIS — J302 Other seasonal allergic rhinitis: Secondary | ICD-10-CM | POA: Diagnosis not present

## 2020-01-24 DIAGNOSIS — I25119 Atherosclerotic heart disease of native coronary artery with unspecified angina pectoris: Secondary | ICD-10-CM | POA: Diagnosis not present

## 2020-01-24 DIAGNOSIS — I10 Essential (primary) hypertension: Secondary | ICD-10-CM | POA: Diagnosis not present

## 2020-01-24 DIAGNOSIS — R21 Rash and other nonspecific skin eruption: Secondary | ICD-10-CM | POA: Diagnosis not present

## 2020-02-07 DIAGNOSIS — R7301 Impaired fasting glucose: Secondary | ICD-10-CM | POA: Diagnosis not present

## 2020-02-07 DIAGNOSIS — I10 Essential (primary) hypertension: Secondary | ICD-10-CM | POA: Diagnosis not present

## 2020-02-07 DIAGNOSIS — E785 Hyperlipidemia, unspecified: Secondary | ICD-10-CM | POA: Diagnosis not present

## 2020-02-12 DIAGNOSIS — R21 Rash and other nonspecific skin eruption: Secondary | ICD-10-CM | POA: Diagnosis not present

## 2020-02-12 DIAGNOSIS — J302 Other seasonal allergic rhinitis: Secondary | ICD-10-CM | POA: Diagnosis not present

## 2020-02-12 DIAGNOSIS — I10 Essential (primary) hypertension: Secondary | ICD-10-CM | POA: Diagnosis not present

## 2020-02-12 DIAGNOSIS — R7303 Prediabetes: Secondary | ICD-10-CM | POA: Diagnosis not present

## 2020-02-12 DIAGNOSIS — E785 Hyperlipidemia, unspecified: Secondary | ICD-10-CM | POA: Diagnosis not present

## 2020-02-12 DIAGNOSIS — F419 Anxiety disorder, unspecified: Secondary | ICD-10-CM | POA: Diagnosis not present

## 2020-02-12 DIAGNOSIS — Z0001 Encounter for general adult medical examination with abnormal findings: Secondary | ICD-10-CM | POA: Diagnosis not present

## 2020-02-12 DIAGNOSIS — I25119 Atherosclerotic heart disease of native coronary artery with unspecified angina pectoris: Secondary | ICD-10-CM | POA: Diagnosis not present

## 2020-03-26 ENCOUNTER — Other Ambulatory Visit: Payer: Self-pay | Admitting: Family Medicine

## 2020-03-26 DIAGNOSIS — E785 Hyperlipidemia, unspecified: Secondary | ICD-10-CM

## 2020-04-03 ENCOUNTER — Other Ambulatory Visit: Payer: Self-pay | Admitting: *Deleted

## 2020-04-03 DIAGNOSIS — I6522 Occlusion and stenosis of left carotid artery: Secondary | ICD-10-CM

## 2020-04-08 ENCOUNTER — Other Ambulatory Visit: Payer: Self-pay

## 2020-04-08 ENCOUNTER — Ambulatory Visit (INDEPENDENT_AMBULATORY_CARE_PROVIDER_SITE_OTHER): Payer: Medicare HMO | Admitting: Physician Assistant

## 2020-04-08 ENCOUNTER — Ambulatory Visit (HOSPITAL_COMMUNITY)
Admission: RE | Admit: 2020-04-08 | Discharge: 2020-04-08 | Disposition: A | Discharge status: Home / Self Care | Payer: Medicare HMO | Source: Ambulatory Visit | Attending: Surgery | Admitting: Surgery

## 2020-04-08 VITALS — BP 135/71 | HR 78 | Temp 97.3°F | Resp 16 | Ht <= 58 in | Wt 151.0 lb

## 2020-04-08 DIAGNOSIS — I6522 Occlusion and stenosis of left carotid artery: Secondary | ICD-10-CM | POA: Insufficient documentation

## 2020-04-08 DIAGNOSIS — E119 Type 2 diabetes mellitus without complications: Secondary | ICD-10-CM | POA: Diagnosis not present

## 2020-04-08 NOTE — Progress Notes (Signed)
History of Present Illness:  Patient is a 69 y.o. year old female who presents for evaluation of carotid stenosis.  She  is status post left symptomatic carotid endarterectomy on 08/06/2014 by Dr. Myra Gianotti. Her  stroke which left her with symptoms of expressive aphasia. The patient denies symptoms of TIA, amaurosis, or stroke.    Past medical history includes: DM, HTN and Hyperlipidemia.  Pt meds include: Statin :yes ASA:yes   Past Medical History:  Diagnosis Date  . Allergy   . Anxiety   . Aphasia    slight aphasia  . Depression   . GERD (gastroesophageal reflux disease)   . Hyperlipidemia   . Hypertension   . Peripheral vascular disease (HCC)    ? in arm lft  . Stroke (HCC)    8/15 earlier in month  . Vertebral artery aneurysm (HCC)    left VA aneursym by 07/2014 MRA (further eval by Dr. Conchita Paris following L CEA surgery)    Past Surgical History:  Procedure Laterality Date  . ENDARTERECTOMY Left 08/03/2014   Procedure: LEFT CAROTID ENDARTERECTOMY  WITH PATCH ANGIOPLASTY;  Surgeon: Nada Libman, MD;  Location: [facility] OR;  Service: Vascular;  Laterality: Left;     Social History Social History   Tobacco Use  . Smoking status: Former Smoker    Packs/day: 0.25    Years: 47.00    Pack years: 11.75    Types: Cigarettes    Quit date: 07/19/2014    Years since quitting: 5.7  . Smokeless tobacco: Never Used  Substance Use Topics  . Alcohol use: No    Alcohol/week: 0.0 standard drinks  . Drug use: No    Family History Family History  Problem Relation Age of Onset  . Heart failure Mother   . Heart disease Mother   . Kidney disease Mother   . Diabetes Mother   . Hypertension Maternal Grandfather   . Stroke Maternal Grandfather   . Diabetes Father   . Heart attack Neg Hx     Allergies  Allergies  Allergen Reactions  . Codeine Other (See Comments)    Makes patient incoherent and involuntarily perform acts that she does not remember.   . Erythromycin  Itching and Rash    Hallucinations  . Penicillins Itching and Rash    Hallucinations   . Eggs Or Egg-Derived Products     Per pt "severely allergic" gets really sick  . Lisinopril Cough  . Ppd [Tuberculin Purified Protein Derivative] Hives  . Dopamine Hcl Nausea Only and Rash     Current Outpatient Medications  Medication Sig Dispense Refill  . amLODipine (NORVASC) 10 MG tablet TAKE 1 TABLET(10 MG) BY MOUTH AT BEDTIME 90 tablet 1  . aspirin EC 81 MG EC tablet Take 1 tablet (81 mg total) by mouth daily. 30 tablet 0  . atorvastatin (LIPITOR) 80 MG tablet TAKE 1 TABLET BY MOUTH EVERY DAY AT 6 PM 90 tablet 0  . loratadine (CLARITIN) 10 MG tablet Take 10 mg by mouth daily as needed for allergies.    Marland Kitchen losartan (COZAAR) 50 MG tablet TAKE 1 TABLET(50 MG) BY MOUTH DAILY 90 tablet 1   No current facility-administered medications for this visit.    ROS:   General:  No weight loss, Fever, chills  HEENT: No recent headaches, no nasal bleeding, no visual changes, no sore throat  Neurologic: No dizziness, blackouts, seizures. No recent symptoms of stroke or mini- stroke. No recent episodes of slurred speech, or  temporary blindness.  Cardiac: No recent episodes of chest pain/pressure, no shortness of breath at rest.  No shortness of breath with exertion.  Denies history of atrial fibrillation or irregular heartbeat  Vascular: No history of rest pain in feet.  No history of claudication.  No history of non-healing ulcer, No history of DVT   Pulmonary: No home oxygen, no productive cough, no hemoptysis,  No asthma or wheezing  Musculoskeletal:  [ ]  Arthritis, [ ]  Low back pain,  [ ]  Joint pain  Hematologic:No history of hypercoagulable state.  No history of easy bleeding.  No history of anemia  Gastrointestinal: No hematochezia or melena,  No gastroesophageal reflux, no trouble swallowing  Urinary: [ ]  chronic Kidney disease, [ ]  on HD - [ ]  MWF or [ ]  TTHS, [ ]  Burning with urination, [ ]   Frequent urination, [ ]  Difficulty urinating;   Skin: No rashes  Psychological: No history of anxiety,  No history of depression   Physical Examination  Vitals:   04/08/20 0841 04/08/20 0844  BP: 138/74 135/71  Pulse: 78 78  Resp: 16   Temp: (!) 97.3 F (36.3 C)   TempSrc: Temporal   SpO2: 99%   Weight: 151 lb (68.5 kg)   Height: 4\' 9"  (1.448 m)     Body mass index is 32.68 kg/m.  General:  Alert and oriented, no acute distress HEENT: Normal Neck: No bruit or JVD Pulmonary: Clear to auscultation bilaterally Cardiac: Regular Rate and Rhythm without murmur Gastrointestinal: Soft, non-tender, non-distended, no mass, no scars Skin: No rash Extremity Pulses:  2+ radial, brachial, femoral, dorsalis pedis, posterior tibial pulses bilaterally Musculoskeletal: No deformity or edema  Neurologic: Upper and lower extremity motor 5/5 and symmetric  DATA:     Right Carotid Findings:  +----------+--------+--------+--------+------------------+--------+       PSV cm/sEDV cm/sStenosisPlaque DescriptionComments  +----------+--------+--------+--------+------------------+--------+  CCA Prox 90   12                      +----------+--------+--------+--------+------------------+--------+  CCA Mid  81   17                      +----------+--------+--------+--------+------------------+--------+  CCA Distal85   21                      +----------+--------+--------+--------+------------------+--------+  ICA Prox 78   16   1-39%  heterogenous         +----------+--------+--------+--------+------------------+--------+  ICA Mid  79   24                      +----------+--------+--------+--------+------------------+--------+  ICA Distal81   25                       +----------+--------+--------+--------+------------------+--------+  ECA    88   12                      +----------+--------+--------+--------+------------------+--------+   +----------+--------+-------+----------------+-------------------+       PSV cm/sEDV cmsDescribe    Arm Pressure (mmHG)  +----------+--------+-------+----------------+-------------------+        Multiphasic, WNL            +----------+--------+-------+----------------+-------------------+   +---------+--------+--+--------+-+---------+  VertebralPSV cm/s48EDV cm/s9Antegrade  +---------+--------+--+--------+-+---------+      Left Carotid Findings:  +----------+--------+--------+--------+------------------+-----------------  -+       PSV cm/sEDV cm/sStenosisPlaque DescriptionComments        +----------+--------+--------+--------+------------------+-----------------  -+  CCA Prox 93   24                            +----------+--------+--------+--------+------------------+-----------------  -+  CCA Mid  83   20                intimal  thickening  +----------+--------+--------+--------+------------------+-----------------  -+  CCA Distal92   24                intimal  thickening  +----------+--------+--------+--------+------------------+-----------------  -+  ICA Prox 106   17   Normal          intimal  thickening  +----------+--------+--------+--------+------------------+-----------------  -+  ICA Mid  76   21                            +----------+--------+--------+--------+------------------+-----------------  -+  ICA Distal75   22                             +----------+--------+--------+--------+------------------+-----------------  -+  ECA    93   11                            +----------+--------+--------+--------+------------------+-----------------  -+   +----------+--------+--------+----------------+-------------------+       PSV cm/sEDV cm/sDescribe    Arm Pressure (mmHG)  +----------+--------+--------+----------------+-------------------+  Subclavian102       Multiphasic, WNL            +----------+--------+--------+----------------+-------------------+   +---------+--------+---+--------+--+---------+  VertebralPSV cm/s114EDV cm/s27Antegrade  +---------+--------+---+--------+--+---------+        Summary:  Right Carotid: Velocities in the right ICA are consistent with a 1-39%  stenosis.   Left Carotid: There is no evidence of stenosis in the left ICA.   Vertebrals: Bilateral vertebral arteries demonstrate antegrade flow.  Subclavians: Normal flow hemodynamics were seen in bilateral subclavian        arteries.   ASSESSMENT:  S/P symptomatic left CEA after left brain stroke in 2015 She denise symptoms of stroke and TIA.    PLAN: She will f/u in 1 year for repeat carotid duplex.  We reviewed signs and symptoms of stroke/TIA.  If she has symptoms she will call 911.  Continue daily activity as tolerates.     Roxy Horseman PA-C [facility] Office: [phone]  MD in clinic [place]

## 2020-08-09 DIAGNOSIS — E785 Hyperlipidemia, unspecified: Secondary | ICD-10-CM | POA: Diagnosis not present

## 2020-08-09 DIAGNOSIS — Z0000 Encounter for general adult medical examination without abnormal findings: Secondary | ICD-10-CM | POA: Diagnosis not present

## 2020-08-09 DIAGNOSIS — R7301 Impaired fasting glucose: Secondary | ICD-10-CM | POA: Diagnosis not present

## 2020-08-20 DIAGNOSIS — E785 Hyperlipidemia, unspecified: Secondary | ICD-10-CM | POA: Diagnosis not present

## 2020-08-20 DIAGNOSIS — R21 Rash and other nonspecific skin eruption: Secondary | ICD-10-CM | POA: Diagnosis not present

## 2020-08-20 DIAGNOSIS — R7303 Prediabetes: Secondary | ICD-10-CM | POA: Diagnosis not present

## 2020-08-20 DIAGNOSIS — J302 Other seasonal allergic rhinitis: Secondary | ICD-10-CM | POA: Diagnosis not present

## 2020-08-20 DIAGNOSIS — I25119 Atherosclerotic heart disease of native coronary artery with unspecified angina pectoris: Secondary | ICD-10-CM | POA: Diagnosis not present

## 2020-08-20 DIAGNOSIS — I10 Essential (primary) hypertension: Secondary | ICD-10-CM | POA: Diagnosis not present

## 2020-08-20 DIAGNOSIS — F419 Anxiety disorder, unspecified: Secondary | ICD-10-CM | POA: Diagnosis not present

## 2021-02-21 DIAGNOSIS — E785 Hyperlipidemia, unspecified: Secondary | ICD-10-CM | POA: Diagnosis not present

## 2021-02-21 DIAGNOSIS — R7303 Prediabetes: Secondary | ICD-10-CM | POA: Diagnosis not present

## 2021-02-21 DIAGNOSIS — I10 Essential (primary) hypertension: Secondary | ICD-10-CM | POA: Diagnosis not present

## 2021-02-26 DIAGNOSIS — E785 Hyperlipidemia, unspecified: Secondary | ICD-10-CM | POA: Diagnosis not present

## 2021-02-26 DIAGNOSIS — I25119 Atherosclerotic heart disease of native coronary artery with unspecified angina pectoris: Secondary | ICD-10-CM | POA: Diagnosis not present

## 2021-02-26 DIAGNOSIS — F419 Anxiety disorder, unspecified: Secondary | ICD-10-CM | POA: Diagnosis not present

## 2021-02-26 DIAGNOSIS — R7303 Prediabetes: Secondary | ICD-10-CM | POA: Diagnosis not present

## 2021-02-26 DIAGNOSIS — R21 Rash and other nonspecific skin eruption: Secondary | ICD-10-CM | POA: Diagnosis not present

## 2021-02-26 DIAGNOSIS — I10 Essential (primary) hypertension: Secondary | ICD-10-CM | POA: Diagnosis not present

## 2021-02-26 DIAGNOSIS — J302 Other seasonal allergic rhinitis: Secondary | ICD-10-CM | POA: Diagnosis not present

## 2021-02-26 DIAGNOSIS — F4310 Post-traumatic stress disorder, unspecified: Secondary | ICD-10-CM | POA: Diagnosis not present

## 2021-04-12 ENCOUNTER — Other Ambulatory Visit: Payer: Self-pay

## 2021-04-12 DIAGNOSIS — I6522 Occlusion and stenosis of left carotid artery: Secondary | ICD-10-CM

## 2021-04-22 ENCOUNTER — Ambulatory Visit: Payer: Medicare HMO | Admitting: Physician Assistant

## 2021-04-22 ENCOUNTER — Other Ambulatory Visit: Payer: Self-pay

## 2021-04-22 ENCOUNTER — Ambulatory Visit (HOSPITAL_COMMUNITY)
Admission: RE | Admit: 2021-04-22 | Discharge: 2021-04-22 | Disposition: A | Discharge status: Home / Self Care | Payer: Medicare HMO | Source: Ambulatory Visit | Attending: Vascular Surgery | Admitting: Vascular Surgery

## 2021-04-22 VITALS — BP 141/78 | HR 67 | Temp 98.3°F | Resp 20 | Ht <= 58 in | Wt 155.2 lb

## 2021-04-22 DIAGNOSIS — I6522 Occlusion and stenosis of left carotid artery: Secondary | ICD-10-CM | POA: Insufficient documentation

## 2021-04-22 DIAGNOSIS — I6523 Occlusion and stenosis of bilateral carotid arteries: Secondary | ICD-10-CM

## 2021-04-22 NOTE — Progress Notes (Signed)
HISTORY AND PHYSICAL     CC:  follow up. Requesting Provider:  Nita Sells, MD  HPI: This is a 70 y.o. female here for follow up for carotid artery stenosis.  Pt is s/p left CEA for symptomatic carotid artery stenosis on 08/06/2014 by Dr. Myra Gianotti.    Pt was last seen 04/08/2020 and at that time she was doing well.  Pt returns today for follow up.    Pt denies any amaurosis fugax, weakness, numbness, paralysis or clumsiness or facial droop.  She originally had expressive aphasia and she still has some component of this but states it hasn't gotten any worse.    She states she has had a lot of anxiety and anger during covid.  She started going to the gym and this has helped tremendously with her anxiety.  She is working on losing weight also. She states that her sugar has also been elevated.  Dr. Margo Aye is following.   The pt is on a statin for cholesterol management.  The pt is on a daily aspirin.   Other AC:  none The pt is on CCB, ARB for hypertension.   The pt is not diabetic.   Tobacco hx:  Tyrone Sage states the smell of cigarettes makes her nauseous.    Pt does not have family hx of AAA.  Past Medical History:  Diagnosis Date  . Allergy   . Anxiety   . Aphasia    slight aphasia  . Depression   . GERD (gastroesophageal reflux disease)   . Hyperlipidemia   . Hypertension   . Peripheral vascular disease (HCC)    ? in arm lft  . Stroke (HCC)    8/15 earlier in month  . Vertebral artery aneurysm (HCC)    left VA aneursym by 07/2014 MRA (further eval by Dr. Conchita Paris following L CEA surgery)    Past Surgical History:  Procedure Laterality Date  . ENDARTERECTOMY Left 08/03/2014   Procedure: LEFT CAROTID ENDARTERECTOMY  WITH PATCH ANGIOPLASTY;  Surgeon: Nada Libman, MD;  [facility] OR;  Service: Vascular;  Laterality: Left;    Allergies  Allergen Reactions  . Codeine Other (See Comments)    Makes patient incoherent and involuntarily perform acts that she does not  remember.  Itching Hallucinate,  . Erythromycin Itching and Rash    Hallucinations  . Penicillins Itching and Rash    Hallucinations   . Amoxicillin Other (See Comments)    Hallucination  . Azithromycin Other (See Comments)    Hallucination  . Eggs Or Egg-Derived Products     Per pt "severely allergic" gets really sick  . Lisinopril Cough  . Ppd [Tuberculin Purified Protein Derivative] Hives  . Dopamine Hcl Nausea Only and Rash    Current Outpatient Medications  Medication Sig Dispense Refill  . amLODipine (NORVASC) 10 MG tablet TAKE 1 TABLET(10 MG) BY MOUTH AT BEDTIME 90 tablet 1  . aspirin EC 81 MG EC tablet Take 1 tablet (81 mg total) by mouth daily. 30 tablet 0  . atorvastatin (LIPITOR) 80 MG tablet TAKE 1 TABLET BY MOUTH EVERY DAY AT 6 PM 90 tablet 0  . loratadine (CLARITIN) 10 MG tablet Take 10 mg by mouth daily as needed for allergies.    Marland Kitchen losartan (COZAAR) 50 MG tablet TAKE 1 TABLET(50 MG) BY MOUTH DAILY 90 tablet 1   No current facility-administered medications for this visit.    Family History  Problem Relation Age of Onset  . Heart failure Mother   .  Heart disease Mother   . Kidney disease Mother   . Diabetes Mother   . Hypertension Maternal Grandfather   . Stroke Maternal Grandfather   . Diabetes Father   . Heart attack Neg Hx     Social History   Socioeconomic History  . Marital status: Divorced    Spouse name: Not on file  . Number of children: 1  . Years of education: college  . Highest education level: Not on file  Occupational History  . Occupation: retired  Tobacco Use  . Smoking status: Former Smoker    Packs/day: 0.25    Years: 47.00    Pack years: 11.75    Types: Cigarettes    Quit date: 07/19/2014    Years since quitting: 6.7  . Smokeless tobacco: Never Used  Vaping Use  . Vaping Use: Never used  Substance and Sexual Activity  . Alcohol use: No    Alcohol/week: 0.0 standard drinks  . Drug use: No  . Sexual activity: Not Currently   Other Topics Concern  . Not on file  Social History Narrative   Patient is divorced with one child.   Patient is right handed.   Patient has college education.   Patient drinks 2 cups daily.   Social Determinants of Health   Financial Resource Strain: Not on file  Food Insecurity: Not on file  Transportation Needs: Not on file  Physical Activity: Not on file  Stress: Not on file  Social Connections: Not on file  Intimate Partner Violence: Not on file     REVIEW OF SYSTEMS:   [X]  denotes positive finding, [ ]  denotes negative finding Cardiac  Comments:  Chest pain or chest pressure:    Shortness of breath upon exertion:    Short of breath when lying flat:    Irregular heart rhythm:        Vascular    Pain in calf, thigh, or hip brought on by ambulation:    Pain in feet at night that wakes you up from your sleep:     Blood clot in your veins:    Leg swelling:         Pulmonary    Oxygen at home:    Productive cough:     Wheezing:         Neurologic    Sudden weakness in arms or legs:     Sudden numbness in arms or legs:     Sudden onset of difficulty speaking or slurred speech:    Temporary loss of vision in one eye:     Problems with dizziness:         Gastrointestinal    Blood in stool:     Vomited blood:         Genitourinary    Burning when urinating:     Blood in urine:        Psychiatric    Major depression:         Hematologic    Bleeding problems:    Problems with blood clotting too easily:        Skin    Rashes or ulcers:        Constitutional    Fever or chills:      PHYSICAL EXAMINATION:  Today's Vitals   04/22/21 1046 04/22/21 1047  BP: (!) 141/75 (!) 141/78  Pulse: 67   Resp: 20   Temp: 98.3 F (36.8 C)   TempSrc: Temporal   SpO2: 98%  Weight: 155 lb 3.2 oz (70.4 kg)   Height: 4\' 9"  (1.448 m)    Body mass index is 33.58 kg/m.   General:  WDWN in NAD; vital signs documented above Gait: Not observed HENT: WNL,  normocephalic Pulmonary: normal non-labored breathing Cardiac: regular HR, without carotid bruits Abdomen: soft, NT; aortic pulse is not palpable Skin: without rashes Vascular Exam/Pulses:  Right Left  Radial 2+ (normal) 2+ (normal)  Ulnar 2+ (normal) Unable to palpate  PT 2+ (normal) 2+ (normal)   Extremities: without ischemic changes, without Gangrene , without cellulitis; without open wounds Musculoskeletal: no muscle wasting or atrophy  Neurologic: A&O X 3; moving all extremities equally; speech is fluent/normal with occasional pauses of trying to find her words.  Psychiatric:  The pt has Normal affect.   Non-Invasive Vascular Imaging:   Carotid Duplex on 04/22/2021: Right:  1-39% ICA stenosis Left:  1-39% ICA stenosis Vertebrals: Bilateral vertebral arteries demonstrate antegrade flow.  Subclavians: Normal flow hemodynamics were seen in bilateral subclavian arteries.  Previous Carotid duplex on 04/08/2020: Right: 1-39% ICA stenosis Left:   normal    ASSESSMENT/PLAN:: 70 y.o. female here for follow up carotid artery stenosis and is s/p left CEA for symptomatic carotid artery stenosis on 08/06/2014 by Dr. 08/08/2014.  -duplex today reveals 1-39% bilateral ICA stenosis -discussed s/s of stroke with pt and she understands should she develop any of these sx, she will go to the nearest ER or call 911. -pt will f/u in one year with carotid duplex -pt will call sooner should they have any issues. -continue statin/asa   Myra Gianotti, [facility] [phone]  Clinic MD:  [phone]

## 2021-08-28 DIAGNOSIS — I10 Essential (primary) hypertension: Secondary | ICD-10-CM | POA: Diagnosis not present

## 2021-08-28 DIAGNOSIS — Z1329 Encounter for screening for other suspected endocrine disorder: Secondary | ICD-10-CM | POA: Diagnosis not present

## 2021-08-28 DIAGNOSIS — Z131 Encounter for screening for diabetes mellitus: Secondary | ICD-10-CM | POA: Diagnosis not present

## 2021-09-24 DIAGNOSIS — I2510 Atherosclerotic heart disease of native coronary artery without angina pectoris: Secondary | ICD-10-CM | POA: Diagnosis not present

## 2021-09-24 DIAGNOSIS — J302 Other seasonal allergic rhinitis: Secondary | ICD-10-CM | POA: Diagnosis not present

## 2021-09-24 DIAGNOSIS — I10 Essential (primary) hypertension: Secondary | ICD-10-CM | POA: Diagnosis not present

## 2021-09-24 DIAGNOSIS — E782 Mixed hyperlipidemia: Secondary | ICD-10-CM | POA: Diagnosis not present

## 2021-09-24 DIAGNOSIS — F419 Anxiety disorder, unspecified: Secondary | ICD-10-CM | POA: Diagnosis not present

## 2021-09-24 DIAGNOSIS — R7303 Prediabetes: Secondary | ICD-10-CM | POA: Diagnosis not present

## 2021-09-24 DIAGNOSIS — R21 Rash and other nonspecific skin eruption: Secondary | ICD-10-CM | POA: Diagnosis not present

## 2021-09-24 DIAGNOSIS — R7401 Elevation of levels of liver transaminase levels: Secondary | ICD-10-CM | POA: Diagnosis not present

## 2021-09-24 DIAGNOSIS — Z0001 Encounter for general adult medical examination with abnormal findings: Secondary | ICD-10-CM | POA: Diagnosis not present

## 2022-03-19 DIAGNOSIS — R7303 Prediabetes: Secondary | ICD-10-CM | POA: Diagnosis not present

## 2022-03-19 DIAGNOSIS — I10 Essential (primary) hypertension: Secondary | ICD-10-CM | POA: Diagnosis not present

## 2022-03-25 DIAGNOSIS — F419 Anxiety disorder, unspecified: Secondary | ICD-10-CM | POA: Diagnosis not present

## 2022-03-25 DIAGNOSIS — R21 Rash and other nonspecific skin eruption: Secondary | ICD-10-CM | POA: Diagnosis not present

## 2022-03-25 DIAGNOSIS — J302 Other seasonal allergic rhinitis: Secondary | ICD-10-CM | POA: Diagnosis not present

## 2022-03-25 DIAGNOSIS — I10 Essential (primary) hypertension: Secondary | ICD-10-CM | POA: Diagnosis not present

## 2022-03-25 DIAGNOSIS — E782 Mixed hyperlipidemia: Secondary | ICD-10-CM | POA: Diagnosis not present

## 2022-03-25 DIAGNOSIS — R7401 Elevation of levels of liver transaminase levels: Secondary | ICD-10-CM | POA: Diagnosis not present

## 2022-03-25 DIAGNOSIS — E669 Obesity, unspecified: Secondary | ICD-10-CM | POA: Diagnosis not present

## 2022-03-25 DIAGNOSIS — I2510 Atherosclerotic heart disease of native coronary artery without angina pectoris: Secondary | ICD-10-CM | POA: Diagnosis not present

## 2022-03-25 DIAGNOSIS — E1165 Type 2 diabetes mellitus with hyperglycemia: Secondary | ICD-10-CM | POA: Diagnosis not present

## 2022-06-29 ENCOUNTER — Other Ambulatory Visit: Payer: Self-pay | Admitting: *Deleted

## 2022-06-29 DIAGNOSIS — I6523 Occlusion and stenosis of bilateral carotid arteries: Secondary | ICD-10-CM

## 2022-07-20 ENCOUNTER — Ambulatory Visit (HOSPITAL_COMMUNITY)
Admission: RE | Admit: 2022-07-20 | Discharge: 2022-07-20 | Disposition: A | Discharge status: Home / Self Care | Payer: Medicare HMO | Source: Ambulatory Visit | Attending: Surgery | Admitting: Surgery

## 2022-07-20 ENCOUNTER — Encounter: Payer: Self-pay | Admitting: Physician Assistant

## 2022-07-20 ENCOUNTER — Ambulatory Visit: Payer: Medicare HMO | Admitting: Physician Assistant

## 2022-07-20 VITALS — BP 136/72 | HR 78 | Temp 97.7°F | Resp 14 | Ht 60.0 in | Wt 135.0 lb

## 2022-07-20 DIAGNOSIS — I6523 Occlusion and stenosis of bilateral carotid arteries: Secondary | ICD-10-CM

## 2022-07-20 NOTE — Progress Notes (Signed)
Office Note     CC:  follow up Requesting Provider:  Donita Brooks, MD  HPI: Diana Wells is a 71 y.o. ([birth year]) female who presents for surveillance of carotid artery stenosis.  She underwent left carotid endarterectomy by Dr. Myra Gianotti in 2015 due to symptomatic left ICA stenosis.  Patient states she still has some speech difficulty.  She denies any diagnosis of CVA or TIA since last office visit.  She also denies any current strokelike symptoms including slurring speech, changes in vision, or one-sided weakness.  She is a former smoker.  She continues to take aspirin and statin daily.   Past Medical History:  Diagnosis Date   Allergy    Anxiety    Aphasia    slight aphasia   Depression    GERD (gastroesophageal reflux disease)    Hyperlipidemia    Hypertension    Peripheral vascular disease (HCC)    ? in arm lft   Stroke (HCC)    8/15 earlier in month   Vertebral artery aneurysm (HCC)    left VA aneursym by 07/2014 [facility] (further eval by Dr. Conchita Paris following L CEA surgery)    Past Surgical History:  Procedure Laterality Date   ENDARTERECTOMY Left 08/03/2014   Procedure: LEFT CAROTID ENDARTERECTOMY  WITH PATCH ANGIOPLASTY;  Surgeon: Nada Libman, MD;  Location: [facility] OR;  Service: Vascular;  Laterality: Left;    Social History   Socioeconomic History   Marital status: Divorced    Spouse name: Not on file   Number of children: 1   Years of education: college   Highest education level: Not on file  Occupational History   Occupation: retired  Tobacco Use   Smoking status: Former    Packs/day: 0.25    Years: 47.00    Total pack years: 11.75    Types: Cigarettes    Quit date: 07/19/2014    Years since quitting: 8.0   Smokeless tobacco: Never  Vaping Use   Vaping Use: Never used  Substance and Sexual Activity   Alcohol use: No    Alcohol/week: 0.0 standard drinks of alcohol   Drug use: No   Sexual activity: Not Currently  Other Topics Concern   Not on file   Social History Narrative   Patient is divorced with one child.   Patient is right handed.   Patient has college education.   Patient drinks 2 cups daily.   Social Determinants of Health   Financial Resource Strain: Not on file  Food Insecurity: Not on file  Transportation Needs: Not on file  Physical Activity: Not on file  Stress: Not on file  Social Connections: Not on file  Intimate Partner Violence: Not on file    Family History  Problem Relation Age of Onset   Heart failure Mother    Heart disease Mother    Kidney disease Mother    Diabetes Mother    Hypertension Maternal Grandfather    Stroke Maternal Grandfather    Diabetes Father    Heart attack Neg Hx     Current Outpatient Medications  Medication Sig Dispense Refill   amLODipine (NORVASC) 10 MG tablet TAKE 1 TABLET(10 MG) BY MOUTH AT BEDTIME 90 tablet 1   aspirin EC 81 MG EC tablet Take 1 tablet (81 mg total) by mouth daily. 30 tablet 0   atorvastatin (LIPITOR) 80 MG tablet TAKE 1 TABLET BY MOUTH EVERY DAY AT 6 PM 90 tablet 0   loratadine (CLARITIN) 10 MG tablet Take  10 mg by mouth daily as needed for allergies.     losartan (COZAAR) 50 MG tablet TAKE 1 TABLET(50 MG) BY MOUTH DAILY 90 tablet 1   No current facility-administered medications for this visit.    Allergies  Allergen Reactions   Codeine Other (See Comments)    Makes patient incoherent and involuntarily perform acts that she does not remember.  Itching Hallucinate,   Erythromycin Itching and Rash    Hallucinations   Penicillins Itching and Rash    Hallucinations    Amoxicillin Other (See Comments)    Hallucination   Azithromycin Other (See Comments)    Hallucination   Eggs Or Egg-Derived Products     Per pt "severely allergic" gets really sick   Lisinopril Cough   Ppd [Tuberculin Purified Protein Derivative] Hives   Dopamine Hcl Nausea Only and Rash     REVIEW OF SYSTEMS:   [X]  denotes positive finding, [ ]  denotes negative  finding Cardiac  Comments:  Chest pain or chest pressure:    Shortness of breath upon exertion:    Short of breath when lying flat:    Irregular heart rhythm:        Vascular    Pain in calf, thigh, or hip brought on by ambulation:    Pain in feet at night that wakes you up from your sleep:     Blood clot in your veins:    Leg swelling:         Pulmonary    Oxygen at home:    Productive cough:     Wheezing:         Neurologic    Sudden weakness in arms or legs:     Sudden numbness in arms or legs:     Sudden onset of difficulty speaking or slurred speech:    Temporary loss of vision in one eye:     Problems with dizziness:         Gastrointestinal    Blood in stool:     Vomited blood:         Genitourinary    Burning when urinating:     Blood in urine:        Psychiatric    Major depression:         Hematologic    Bleeding problems:    Problems with blood clotting too easily:        Skin    Rashes or ulcers:        Constitutional    Fever or chills:      PHYSICAL EXAMINATION:  Vitals:   07/20/22 1245 07/20/22 1250  BP: 137/67 136/72  Pulse: 77 78  Resp: 14   Temp: 97.7 F (36.5 C)   TempSrc: Temporal   SpO2: 97%   Weight: 135 lb (61.2 kg)   Height: 5' (1.524 m)     General:  WDWN in NAD; vital signs documented above Gait: Not observed HENT: WNL, normocephalic Pulmonary: normal non-labored breathing , without Rales, rhonchi,  wheezing Cardiac: regular HR Abdomen: soft, NT, no masses Skin: without rashes Vascular Exam/Pulses:  Right Left  Radial 2+ (normal) 2+ (normal)   Extremities: without ischemic changes, without Gangrene , without cellulitis; without open wounds;  Musculoskeletal: no muscle wasting or atrophy  Neurologic: A&O X 3;  CN grossly intact Psychiatric:  The pt has Normal affect.   Non-Invasive Vascular Imaging:   Right ICA 1 to 39% stenosis Left ICA 1 to 39% stenosis, widely patent endarterectomy  site    ASSESSMENT/PLAN:: 71 y.o. female here for follow up for surveillance of carotid artery stenosis  -Subjectively the patient has not experienced any strokelike symptoms since last office visit including slurring speech, changes in vision, or one-sided weakness -Carotid duplex demonstrates 1 to 39% stenosis of the right ICA and 1 to 39% stenosis of the left ICA -Continue aspirin and statin daily -Recheck carotid duplex in 2 years  Emilie Rutter, PA-C [facility] [phone]  Clinic MD:   Myra Gianotti

## 2022-09-15 DIAGNOSIS — E1165 Type 2 diabetes mellitus with hyperglycemia: Secondary | ICD-10-CM | POA: Diagnosis not present

## 2022-09-15 DIAGNOSIS — Z0000 Encounter for general adult medical examination without abnormal findings: Secondary | ICD-10-CM | POA: Diagnosis not present

## 2022-09-24 DIAGNOSIS — E782 Mixed hyperlipidemia: Secondary | ICD-10-CM | POA: Diagnosis not present

## 2022-09-24 DIAGNOSIS — I10 Essential (primary) hypertension: Secondary | ICD-10-CM | POA: Diagnosis not present

## 2022-09-24 DIAGNOSIS — R238 Other skin changes: Secondary | ICD-10-CM | POA: Diagnosis not present

## 2022-09-24 DIAGNOSIS — E1165 Type 2 diabetes mellitus with hyperglycemia: Secondary | ICD-10-CM | POA: Diagnosis not present

## 2022-09-24 DIAGNOSIS — Z0000 Encounter for general adult medical examination without abnormal findings: Secondary | ICD-10-CM | POA: Diagnosis not present

## 2022-09-24 DIAGNOSIS — I2510 Atherosclerotic heart disease of native coronary artery without angina pectoris: Secondary | ICD-10-CM | POA: Diagnosis not present

## 2022-09-24 DIAGNOSIS — Z0001 Encounter for general adult medical examination with abnormal findings: Secondary | ICD-10-CM | POA: Diagnosis not present

## 2022-09-24 DIAGNOSIS — D72829 Elevated white blood cell count, unspecified: Secondary | ICD-10-CM | POA: Diagnosis not present

## 2022-09-24 DIAGNOSIS — R7401 Elevation of levels of liver transaminase levels: Secondary | ICD-10-CM | POA: Diagnosis not present

## 2023-03-23 DIAGNOSIS — E1165 Type 2 diabetes mellitus with hyperglycemia: Secondary | ICD-10-CM | POA: Diagnosis not present

## 2023-03-23 DIAGNOSIS — I10 Essential (primary) hypertension: Secondary | ICD-10-CM | POA: Diagnosis not present

## 2023-03-23 DIAGNOSIS — E782 Mixed hyperlipidemia: Secondary | ICD-10-CM | POA: Diagnosis not present

## 2023-03-26 DIAGNOSIS — Z0000 Encounter for general adult medical examination without abnormal findings: Secondary | ICD-10-CM | POA: Diagnosis not present

## 2023-03-29 DIAGNOSIS — E663 Overweight: Secondary | ICD-10-CM | POA: Diagnosis not present

## 2023-03-29 DIAGNOSIS — D72829 Elevated white blood cell count, unspecified: Secondary | ICD-10-CM | POA: Diagnosis not present

## 2023-03-29 DIAGNOSIS — I10 Essential (primary) hypertension: Secondary | ICD-10-CM | POA: Diagnosis not present

## 2023-03-29 DIAGNOSIS — R413 Other amnesia: Secondary | ICD-10-CM | POA: Diagnosis not present

## 2023-03-29 DIAGNOSIS — E782 Mixed hyperlipidemia: Secondary | ICD-10-CM | POA: Diagnosis not present

## 2023-03-29 DIAGNOSIS — S60521A Blister (nonthermal) of right hand, initial encounter: Secondary | ICD-10-CM | POA: Diagnosis not present

## 2023-03-29 DIAGNOSIS — I2510 Atherosclerotic heart disease of native coronary artery without angina pectoris: Secondary | ICD-10-CM | POA: Diagnosis not present

## 2023-03-29 DIAGNOSIS — E1165 Type 2 diabetes mellitus with hyperglycemia: Secondary | ICD-10-CM | POA: Diagnosis not present

## 2023-03-29 DIAGNOSIS — R7401 Elevation of levels of liver transaminase levels: Secondary | ICD-10-CM | POA: Diagnosis not present

## 2023-05-12 DIAGNOSIS — E782 Mixed hyperlipidemia: Secondary | ICD-10-CM | POA: Diagnosis not present

## 2023-05-12 DIAGNOSIS — R413 Other amnesia: Secondary | ICD-10-CM | POA: Diagnosis not present

## 2023-05-12 DIAGNOSIS — Z789 Other specified health status: Secondary | ICD-10-CM | POA: Diagnosis not present

## 2023-05-12 DIAGNOSIS — R238 Other skin changes: Secondary | ICD-10-CM | POA: Diagnosis not present

## 2023-07-15 DIAGNOSIS — E1165 Type 2 diabetes mellitus with hyperglycemia: Secondary | ICD-10-CM | POA: Diagnosis not present

## 2023-07-15 DIAGNOSIS — E782 Mixed hyperlipidemia: Secondary | ICD-10-CM | POA: Diagnosis not present

## 2023-07-21 DIAGNOSIS — Z789 Other specified health status: Secondary | ICD-10-CM | POA: Diagnosis not present

## 2023-07-21 DIAGNOSIS — I2510 Atherosclerotic heart disease of native coronary artery without angina pectoris: Secondary | ICD-10-CM | POA: Diagnosis not present

## 2023-07-21 DIAGNOSIS — R238 Other skin changes: Secondary | ICD-10-CM | POA: Diagnosis not present

## 2023-07-21 DIAGNOSIS — R413 Other amnesia: Secondary | ICD-10-CM | POA: Diagnosis not present

## 2023-07-21 DIAGNOSIS — E782 Mixed hyperlipidemia: Secondary | ICD-10-CM | POA: Diagnosis not present

## 2023-07-21 DIAGNOSIS — R7401 Elevation of levels of liver transaminase levels: Secondary | ICD-10-CM | POA: Diagnosis not present

## 2023-07-21 DIAGNOSIS — D72829 Elevated white blood cell count, unspecified: Secondary | ICD-10-CM | POA: Diagnosis not present

## 2023-07-21 DIAGNOSIS — I10 Essential (primary) hypertension: Secondary | ICD-10-CM | POA: Diagnosis not present

## 2023-07-21 DIAGNOSIS — E1165 Type 2 diabetes mellitus with hyperglycemia: Secondary | ICD-10-CM | POA: Diagnosis not present

## 2023-07-26 DIAGNOSIS — Z1211 Encounter for screening for malignant neoplasm of colon: Secondary | ICD-10-CM | POA: Diagnosis not present

## 2023-07-31 LAB — COLOGUARD: COLOGUARD: POSITIVE — AB

## 2023-08-04 ENCOUNTER — Encounter (INDEPENDENT_AMBULATORY_CARE_PROVIDER_SITE_OTHER): Payer: Self-pay | Admitting: *Deleted

## 2024-01-18 DIAGNOSIS — E1165 Type 2 diabetes mellitus with hyperglycemia: Secondary | ICD-10-CM | POA: Diagnosis not present

## 2024-01-18 DIAGNOSIS — E782 Mixed hyperlipidemia: Secondary | ICD-10-CM | POA: Diagnosis not present

## 2024-01-25 DIAGNOSIS — D72829 Elevated white blood cell count, unspecified: Secondary | ICD-10-CM | POA: Diagnosis not present

## 2024-01-25 DIAGNOSIS — E1165 Type 2 diabetes mellitus with hyperglycemia: Secondary | ICD-10-CM | POA: Diagnosis not present

## 2024-01-25 DIAGNOSIS — R7401 Elevation of levels of liver transaminase levels: Secondary | ICD-10-CM | POA: Diagnosis not present

## 2024-01-25 DIAGNOSIS — E663 Overweight: Secondary | ICD-10-CM | POA: Diagnosis not present

## 2024-01-25 DIAGNOSIS — R413 Other amnesia: Secondary | ICD-10-CM | POA: Diagnosis not present

## 2024-01-25 DIAGNOSIS — E782 Mixed hyperlipidemia: Secondary | ICD-10-CM | POA: Diagnosis not present

## 2024-01-25 DIAGNOSIS — G720 Drug-induced myopathy: Secondary | ICD-10-CM | POA: Diagnosis not present

## 2024-01-25 DIAGNOSIS — I2510 Atherosclerotic heart disease of native coronary artery without angina pectoris: Secondary | ICD-10-CM | POA: Diagnosis not present

## 2024-01-25 DIAGNOSIS — I10 Essential (primary) hypertension: Secondary | ICD-10-CM | POA: Diagnosis not present

## 2024-02-07 ENCOUNTER — Encounter (INDEPENDENT_AMBULATORY_CARE_PROVIDER_SITE_OTHER): Payer: Self-pay | Admitting: *Deleted

## 2024-06-19 DIAGNOSIS — E782 Mixed hyperlipidemia: Secondary | ICD-10-CM | POA: Diagnosis not present

## 2024-06-19 DIAGNOSIS — E1165 Type 2 diabetes mellitus with hyperglycemia: Secondary | ICD-10-CM | POA: Diagnosis not present

## 2024-06-23 DIAGNOSIS — G720 Drug-induced myopathy: Secondary | ICD-10-CM | POA: Diagnosis not present

## 2024-06-23 DIAGNOSIS — R7401 Elevation of levels of liver transaminase levels: Secondary | ICD-10-CM | POA: Diagnosis not present

## 2024-06-23 DIAGNOSIS — E1165 Type 2 diabetes mellitus with hyperglycemia: Secondary | ICD-10-CM | POA: Diagnosis not present

## 2024-06-23 DIAGNOSIS — R413 Other amnesia: Secondary | ICD-10-CM | POA: Diagnosis not present

## 2024-06-23 DIAGNOSIS — D72829 Elevated white blood cell count, unspecified: Secondary | ICD-10-CM | POA: Diagnosis not present

## 2024-06-23 DIAGNOSIS — E663 Overweight: Secondary | ICD-10-CM | POA: Diagnosis not present

## 2024-06-23 DIAGNOSIS — I2510 Atherosclerotic heart disease of native coronary artery without angina pectoris: Secondary | ICD-10-CM | POA: Diagnosis not present

## 2024-06-23 DIAGNOSIS — I10 Essential (primary) hypertension: Secondary | ICD-10-CM | POA: Diagnosis not present

## 2024-06-23 DIAGNOSIS — E782 Mixed hyperlipidemia: Secondary | ICD-10-CM | POA: Diagnosis not present

## 2024-08-28 ENCOUNTER — Encounter (HOSPITAL_COMMUNITY)

## 2024-08-28 ENCOUNTER — Ambulatory Visit

## 2024-09-01 ENCOUNTER — Other Ambulatory Visit: Payer: Self-pay

## 2024-09-01 DIAGNOSIS — I6523 Occlusion and stenosis of bilateral carotid arteries: Secondary | ICD-10-CM

## 2024-10-09 ENCOUNTER — Ambulatory Visit (HOSPITAL_COMMUNITY): Admission: RE | Admit: 2024-10-09 | Source: Ambulatory Visit

## 2024-10-09 ENCOUNTER — Ambulatory Visit

## 2024-10-09 ENCOUNTER — Ambulatory Visit (HOSPITAL_COMMUNITY)
Admission: RE | Admit: 2024-10-09 | Discharge: 2024-10-09 | Disposition: A | Discharge status: Home / Self Care | Source: Ambulatory Visit | Attending: Surgery | Admitting: Surgery

## 2024-10-09 DIAGNOSIS — I6523 Occlusion and stenosis of bilateral carotid arteries: Secondary | ICD-10-CM | POA: Insufficient documentation
